# Patient Record
Sex: Female | Born: 1950 | Race: Asian | Hispanic: No | Smoking: Never smoker
Health system: Southern US, Community
[De-identification: ages and names within clinical notes are randomized; demographics above are authoritative.]

## PROBLEM LIST (undated history)

## (undated) DIAGNOSIS — K219 Gastro-esophageal reflux disease without esophagitis: Secondary | ICD-10-CM

## (undated) DIAGNOSIS — I10 Essential (primary) hypertension: Secondary | ICD-10-CM

## (undated) DIAGNOSIS — E039 Hypothyroidism, unspecified: Secondary | ICD-10-CM

## (undated) DIAGNOSIS — K8020 Calculus of gallbladder without cholecystitis without obstruction: Secondary | ICD-10-CM

## (undated) DIAGNOSIS — B181 Chronic viral hepatitis B without delta-agent: Secondary | ICD-10-CM

## (undated) DIAGNOSIS — E0500 Thyrotoxicosis with diffuse goiter without thyrotoxic crisis or storm: Secondary | ICD-10-CM

## (undated) DIAGNOSIS — R7689 Other specified abnormal immunological findings in serum: Secondary | ICD-10-CM

## (undated) DIAGNOSIS — R768 Other specified abnormal immunological findings in serum: Secondary | ICD-10-CM

## (undated) DIAGNOSIS — E790 Hyperuricemia without signs of inflammatory arthritis and tophaceous disease: Secondary | ICD-10-CM

## (undated) DIAGNOSIS — E119 Type 2 diabetes mellitus without complications: Secondary | ICD-10-CM

## (undated) HISTORY — PX: TUBAL LIGATION: SHX77

---

## 2003-10-17 ENCOUNTER — Other Ambulatory Visit: Payer: Self-pay

## 2005-06-17 ENCOUNTER — Ambulatory Visit: Payer: Self-pay | Admitting: Unknown Physician Specialty

## 2005-09-03 ENCOUNTER — Ambulatory Visit: Payer: Self-pay | Admitting: Unknown Physician Specialty

## 2006-03-18 ENCOUNTER — Ambulatory Visit: Payer: Self-pay | Admitting: Rheumatology

## 2006-12-05 ENCOUNTER — Ambulatory Visit: Payer: Self-pay | Admitting: Gastroenterology

## 2006-12-25 ENCOUNTER — Ambulatory Visit: Payer: Self-pay | Admitting: Family Medicine

## 2007-12-25 ENCOUNTER — Ambulatory Visit: Payer: Self-pay | Admitting: Gastroenterology

## 2010-06-06 ENCOUNTER — Ambulatory Visit: Payer: Self-pay | Admitting: Internal Medicine

## 2010-06-08 ENCOUNTER — Ambulatory Visit: Payer: Self-pay | Admitting: Internal Medicine

## 2010-07-24 ENCOUNTER — Ambulatory Visit: Payer: Self-pay | Admitting: Gastroenterology

## 2010-07-25 LAB — PATHOLOGY REPORT

## 2010-11-15 ENCOUNTER — Ambulatory Visit: Payer: Self-pay | Admitting: Family Medicine

## 2013-07-03 ENCOUNTER — Emergency Department: Payer: Self-pay | Admitting: Emergency Medicine

## 2014-01-12 ENCOUNTER — Ambulatory Visit: Payer: Self-pay | Admitting: Family Medicine

## 2014-07-15 ENCOUNTER — Emergency Department: Payer: Self-pay | Admitting: Emergency Medicine

## 2014-09-19 ENCOUNTER — Ambulatory Visit
Admission: RE | Admit: 2014-09-19 | Payer: BLUE CROSS/BLUE SHIELD | Source: Ambulatory Visit | Admitting: Gastroenterology

## 2014-09-19 ENCOUNTER — Encounter: Admission: RE | Payer: Self-pay | Source: Ambulatory Visit

## 2014-09-19 SURGERY — EGD (ESOPHAGOGASTRODUODENOSCOPY)
Anesthesia: General

## 2014-12-12 ENCOUNTER — Encounter: Payer: Self-pay | Admitting: *Deleted

## 2014-12-12 DIAGNOSIS — E0590 Thyrotoxicosis, unspecified without thyrotoxic crisis or storm: Secondary | ICD-10-CM | POA: Diagnosis not present

## 2014-12-12 DIAGNOSIS — K210 Gastro-esophageal reflux disease with esophagitis: Secondary | ICD-10-CM | POA: Diagnosis not present

## 2014-12-12 DIAGNOSIS — Z79899 Other long term (current) drug therapy: Secondary | ICD-10-CM | POA: Diagnosis not present

## 2014-12-12 DIAGNOSIS — R1013 Epigastric pain: Secondary | ICD-10-CM | POA: Diagnosis present

## 2014-12-12 DIAGNOSIS — E119 Type 2 diabetes mellitus without complications: Secondary | ICD-10-CM | POA: Diagnosis not present

## 2014-12-12 DIAGNOSIS — B1910 Unspecified viral hepatitis B without hepatic coma: Secondary | ICD-10-CM | POA: Diagnosis not present

## 2014-12-12 DIAGNOSIS — I10 Essential (primary) hypertension: Secondary | ICD-10-CM | POA: Diagnosis not present

## 2014-12-12 DIAGNOSIS — K2270 Barrett's esophagus without dysplasia: Secondary | ICD-10-CM | POA: Diagnosis not present

## 2014-12-12 DIAGNOSIS — I252 Old myocardial infarction: Secondary | ICD-10-CM | POA: Diagnosis not present

## 2014-12-12 NOTE — Anesthesia Preprocedure Evaluation (Addendum)
Anesthesia Evaluation  Patient identified by MRN, date of birth, ID band Patient awake    Reviewed: Allergy & Precautions, H&P , NPO status , Patient's Chart, lab work & pertinent test results  Airway Mallampati: II  TM Distance: >3 FB Neck ROM: full    Dental  (+) Poor Dentition, Chipped, Missing   Pulmonary neg pulmonary ROS,  breath sounds clear to auscultation  Pulmonary exam normal       Cardiovascular Exercise Tolerance: Good hypertension, - Past MI Normal cardiovascular examRhythm:regular Rate:Normal     Neuro/Psych negative neurological ROS  negative psych ROS   GI/Hepatic GERD-  Controlled,(+) Hepatitis -, B  Endo/Other  negative endocrine ROSdiabetes, Type 2Hypothyroidism Hyperthyroidism   Renal/GU negative Renal ROS  negative genitourinary   Musculoskeletal   Abdominal   Peds  Hematology negative hematology ROS (+)   Anesthesia Other Findings Past Medical History:   Hypertension                                                 Graves disease                                               Hyperuricemia                                                Rheumatoid factor positive                                   Chronic hepatitis B                                          Acquired hypothyroidism                                      Diabetes mellitus without complication                       GERD (gastroesophageal reflux disease)                       Reproductive/Obstetrics negative OB ROS                            Anesthesia Physical Anesthesia Plan  ASA: III  Anesthesia Plan: General   Post-op Pain Management:    Induction:   Airway Management Planned:   Additional Equipment:   Intra-op Plan:   Post-operative Plan:   Informed Consent: I have reviewed the patients History and Physical, chart, labs and discussed the procedure including the risks, benefits and  alternatives for the proposed anesthesia with the patient or authorized representative who has indicated his/her understanding and acceptance.   Dental Advisory Given  Plan Discussed with: Anesthesiologist, CRNA and Surgeon  Anesthesia Plan Comments:  Anesthesia Quick Evaluation  

## 2014-12-13 ENCOUNTER — Encounter: Payer: Self-pay | Admitting: *Deleted

## 2014-12-13 ENCOUNTER — Ambulatory Visit
Admission: RE | Admit: 2014-12-13 | Discharge: 2014-12-13 | Disposition: A | Discharge status: Home / Self Care | Payer: BLUE CROSS/BLUE SHIELD | Source: Ambulatory Visit | Attending: Gastroenterology | Admitting: Gastroenterology

## 2014-12-13 ENCOUNTER — Encounter
Admission: RE | Disposition: A | Discharge status: Home / Self Care | Payer: Self-pay | Source: Ambulatory Visit | Attending: Gastroenterology

## 2014-12-13 ENCOUNTER — Ambulatory Visit: Payer: BLUE CROSS/BLUE SHIELD | Admitting: Anesthesiology

## 2014-12-13 DIAGNOSIS — B1910 Unspecified viral hepatitis B without hepatic coma: Secondary | ICD-10-CM | POA: Insufficient documentation

## 2014-12-13 DIAGNOSIS — I10 Essential (primary) hypertension: Secondary | ICD-10-CM | POA: Insufficient documentation

## 2014-12-13 DIAGNOSIS — K210 Gastro-esophageal reflux disease with esophagitis: Secondary | ICD-10-CM | POA: Insufficient documentation

## 2014-12-13 DIAGNOSIS — K2270 Barrett's esophagus without dysplasia: Secondary | ICD-10-CM | POA: Insufficient documentation

## 2014-12-13 DIAGNOSIS — E0590 Thyrotoxicosis, unspecified without thyrotoxic crisis or storm: Secondary | ICD-10-CM | POA: Insufficient documentation

## 2014-12-13 DIAGNOSIS — I252 Old myocardial infarction: Secondary | ICD-10-CM | POA: Insufficient documentation

## 2014-12-13 DIAGNOSIS — Z79899 Other long term (current) drug therapy: Secondary | ICD-10-CM | POA: Insufficient documentation

## 2014-12-13 DIAGNOSIS — E119 Type 2 diabetes mellitus without complications: Secondary | ICD-10-CM | POA: Insufficient documentation

## 2014-12-13 HISTORY — DX: Chronic viral hepatitis B without delta-agent: B18.1

## 2014-12-13 HISTORY — DX: Type 2 diabetes mellitus without complications: E11.9

## 2014-12-13 HISTORY — DX: Other specified abnormal immunological findings in serum: R76.89

## 2014-12-13 HISTORY — DX: Hyperuricemia without signs of inflammatory arthritis and tophaceous disease: E79.0

## 2014-12-13 HISTORY — DX: Thyrotoxicosis with diffuse goiter without thyrotoxic crisis or storm: E05.00

## 2014-12-13 HISTORY — DX: Essential (primary) hypertension: I10

## 2014-12-13 HISTORY — DX: Hypothyroidism, unspecified: E03.9

## 2014-12-13 HISTORY — PX: ESOPHAGOGASTRODUODENOSCOPY (EGD) WITH PROPOFOL: SHX5813

## 2014-12-13 HISTORY — DX: Gastro-esophageal reflux disease without esophagitis: K21.9

## 2014-12-13 HISTORY — DX: Other specified abnormal immunological findings in serum: R76.8

## 2014-12-13 SURGERY — ESOPHAGOGASTRODUODENOSCOPY (EGD) WITH PROPOFOL
Anesthesia: General

## 2014-12-13 MED ORDER — LIDOCAINE HCL (CARDIAC) 20 MG/ML IV SOLN
INTRAVENOUS | Status: DC | PRN
  Administered 2014-12-13: 20 mg via INTRAVENOUS

## 2014-12-13 MED ORDER — GLYCOPYRROLATE 0.2 MG/ML IJ SOLN
INTRAMUSCULAR | Status: DC | PRN
  Administered 2014-12-13: 0.2 mg via INTRAVENOUS

## 2014-12-13 MED ORDER — SODIUM CHLORIDE 0.9 % IV SOLN: INTRAVENOUS | Status: DC

## 2014-12-13 MED ORDER — SODIUM CHLORIDE 0.9 % IV SOLN
INTRAVENOUS | Status: DC
  Administered 2014-12-13: 09:00:00 via INTRAVENOUS

## 2014-12-13 MED ORDER — PROPOFOL 10 MG/ML IV BOLUS
INTRAVENOUS | Status: DC | PRN
  Administered 2014-12-13: 60 mg via INTRAVENOUS
  Administered 2014-12-13: 30 mg via INTRAVENOUS

## 2014-12-13 MED ORDER — PROPOFOL INFUSION 10 MG/ML OPTIME
INTRAVENOUS | Status: DC | PRN
  Administered 2014-12-13: 120 ug/kg/min via INTRAVENOUS

## 2014-12-13 NOTE — Anesthesia Postprocedure Evaluation (Signed)
  Anesthesia Post-op Note  Patient: Sydney Howe  Procedure(s) Performed: Procedure(s): ESOPHAGOGASTRODUODENOSCOPY (EGD) WITH PROPOFOL (N/A)  Anesthesia type:General  Patient location: PACU  Post pain: Pain level controlled  Post assessment: Post-op Vital signs reviewed, Patient's Cardiovascular Status Stable, Respiratory Function Stable, Patent Airway and No signs of Nausea or vomiting  Post vital signs: Reviewed and stable  Last Vitals:  Filed Vitals:   12/13/14 1035  BP: 112/76  Pulse: 51  Temp:   Resp: 13    Level of consciousness: awake, alert  and patient cooperative  Complications: No apparent anesthesia complications

## 2014-12-13 NOTE — Op Note (Signed)
[facility] Gastroenterology Patient Name: [facility] Procedure Date: 12/13/2014 9:42 AM MRN: [id number] Account #: [id number] Date of Birth: 09/01/1950 Admit Type: Outpatient Age: 64 Room: [facility] ENDO ROOM 4 Gender: Female Note Status: Finalized Procedure:         Upper GI endoscopy Indications:       Epigastric abdominal pain, Suspected esophageal reflux,                     Possible Barrett's on previous EGD in 2012. Providers:         Ezzard Standing. Bluford Kaufmann, MD Medicines:         Monitored Anesthesia Care Complications:     No immediate complications. Procedure:         Pre-Anesthesia Assessment:                    - Prior to the procedure, a History and Physical was                     performed, and patient medications, allergies and                     sensitivities were reviewed. The patient's tolerance of                     previous anesthesia was reviewed.                    - The risks and benefits of the procedure and the sedation                     options and risks were discussed with the patient. All                     questions were answered and informed consent was obtained.                    - After reviewing the risks and benefits, the patient was                     deemed in satisfactory condition to undergo the procedure.                    After obtaining informed consent, the endoscope was passed                     under direct vision. Throughout the procedure, the                     patient's blood pressure, pulse, and oxygen saturations                     were monitored continuously. The Olympus GIF-160 endoscope                     (S#. [id number]) was introduced through the mouth, and                     advanced to the second part of duodenum. The upper GI                     endoscopy was accomplished without difficulty. The patient                     tolerated the procedure  well. Findings:      The examined esophagus was normal. Multiple  biopsies of GE junction area       were taken with a cold forceps for histology.      The entire examined stomach was normal.      The examined duodenum was normal. Impression:        - Normal esophagus. Biopsied.                    - Normal stomach.                    - Normal examined duodenum. Recommendation:    - Discharge patient to home.                    - Observe patient's clinical course.                    - Await pathology results.                    - Continue present medications.                    - The findings and recommendations were discussed with the                     patient.                    - If bx confirm Barrett's, then will need EGD q 104yrs. Procedure Code(s): --- Professional ---                    [phone], Esophagogastroduodenoscopy, flexible, transoral;                     with biopsy, single or multiple Diagnosis Code(s): --- Professional ---                    R10.13, Epigastric pain CPT copyright 2014 American Medical Association. All rights reserved. The codes documented in this report are preliminary and upon coder review may  be revised to meet current compliance requirements. Wallace Cullens, MD 12/13/2014 10:00:51 AM This report has been signed electronically. Number of Addenda: 0 Note Initiated On: 12/13/2014 9:42 AM      [facility]

## 2014-12-13 NOTE — OR Nursing (Signed)
[place]otian interpreter present the entire recovery time.  All instructions and perscriptions were given to the son at time of discharge.

## 2014-12-13 NOTE — H&P (Signed)
  Date of Initial H&P: 12/08/2014  History reviewed, patient examined, no change in status, stable for surgery.

## 2014-12-13 NOTE — Transfer of Care (Signed)
Immediate Anesthesia Transfer of Care Note  Patient: Sydney Howe  Procedure(s) Performed: Procedure(s): ESOPHAGOGASTRODUODENOSCOPY (EGD) WITH PROPOFOL (N/A)  Patient Location: PACU and Endoscopy Unit  Anesthesia Type:General  Level of Consciousness: sedated  Airway & Oxygen Therapy: Patient Spontanous Breathing and Patient connected to nasal cannula oxygen  Post-op Assessment: Report given to RN and Post -op Vital signs reviewed and stable  Post vital signs: Reviewed and stable  Last Vitals:  Filed Vitals:   12/13/14 0919  BP: 125/71  Pulse: 58  Temp: 36.3 C  Resp: 18    Complications: No apparent anesthesia complications

## 2014-12-14 ENCOUNTER — Encounter: Payer: Self-pay | Admitting: Gastroenterology

## 2014-12-14 LAB — SURGICAL PATHOLOGY

## 2016-03-27 ENCOUNTER — Other Ambulatory Visit: Payer: Self-pay | Admitting: Family Medicine

## 2016-03-27 DIAGNOSIS — Z1231 Encounter for screening mammogram for malignant neoplasm of breast: Secondary | ICD-10-CM

## 2016-05-03 ENCOUNTER — Ambulatory Visit
Admission: RE | Admit: 2016-05-03 | Discharge: 2016-05-03 | Disposition: A | Discharge status: Home / Self Care | Payer: BLUE CROSS/BLUE SHIELD | Source: Ambulatory Visit | Attending: Family Medicine | Admitting: Family Medicine

## 2016-05-03 DIAGNOSIS — Z1231 Encounter for screening mammogram for malignant neoplasm of breast: Secondary | ICD-10-CM | POA: Diagnosis present

## 2017-03-11 ENCOUNTER — Other Ambulatory Visit: Payer: Self-pay | Admitting: Family Medicine

## 2017-03-11 DIAGNOSIS — Z1231 Encounter for screening mammogram for malignant neoplasm of breast: Secondary | ICD-10-CM

## 2017-05-05 ENCOUNTER — Ambulatory Visit
Admission: RE | Admit: 2017-05-05 | Discharge: 2017-05-05 | Disposition: A | Discharge status: Home / Self Care | Payer: BLUE CROSS/BLUE SHIELD | Source: Ambulatory Visit | Attending: Family Medicine | Admitting: Family Medicine

## 2017-05-05 DIAGNOSIS — Z1231 Encounter for screening mammogram for malignant neoplasm of breast: Secondary | ICD-10-CM | POA: Diagnosis not present

## 2017-09-09 ENCOUNTER — Other Ambulatory Visit: Payer: Self-pay

## 2017-09-09 ENCOUNTER — Emergency Department: Payer: BLUE CROSS/BLUE SHIELD

## 2017-09-09 ENCOUNTER — Inpatient Hospital Stay
Admission: EM | Admit: 2017-09-09 | Discharge: 2017-09-12 | DRG: 439 | Disposition: A | Discharge status: Home / Self Care | Payer: BLUE CROSS/BLUE SHIELD | Attending: Internal Medicine | Admitting: Internal Medicine

## 2017-09-09 ENCOUNTER — Encounter: Payer: Self-pay | Admitting: Emergency Medicine

## 2017-09-09 DIAGNOSIS — R001 Bradycardia, unspecified: Secondary | ICD-10-CM | POA: Diagnosis not present

## 2017-09-09 DIAGNOSIS — K8500 Idiopathic acute pancreatitis without necrosis or infection: Secondary | ICD-10-CM | POA: Diagnosis present

## 2017-09-09 DIAGNOSIS — Z7984 Long term (current) use of oral hypoglycemic drugs: Secondary | ICD-10-CM

## 2017-09-09 DIAGNOSIS — I10 Essential (primary) hypertension: Secondary | ICD-10-CM | POA: Diagnosis present

## 2017-09-09 DIAGNOSIS — K219 Gastro-esophageal reflux disease without esophagitis: Secondary | ICD-10-CM | POA: Diagnosis present

## 2017-09-09 DIAGNOSIS — E0500 Thyrotoxicosis with diffuse goiter without thyrotoxic crisis or storm: Secondary | ICD-10-CM | POA: Diagnosis present

## 2017-09-09 DIAGNOSIS — E860 Dehydration: Secondary | ICD-10-CM | POA: Diagnosis present

## 2017-09-09 DIAGNOSIS — E871 Hypo-osmolality and hyponatremia: Secondary | ICD-10-CM | POA: Diagnosis present

## 2017-09-09 DIAGNOSIS — T464X5A Adverse effect of angiotensin-converting-enzyme inhibitors, initial encounter: Secondary | ICD-10-CM | POA: Diagnosis present

## 2017-09-09 DIAGNOSIS — E119 Type 2 diabetes mellitus without complications: Secondary | ICD-10-CM | POA: Diagnosis present

## 2017-09-09 DIAGNOSIS — I959 Hypotension, unspecified: Secondary | ICD-10-CM | POA: Diagnosis not present

## 2017-09-09 DIAGNOSIS — K853 Drug induced acute pancreatitis without necrosis or infection: Secondary | ICD-10-CM | POA: Diagnosis present

## 2017-09-09 DIAGNOSIS — K859 Acute pancreatitis without necrosis or infection, unspecified: Secondary | ICD-10-CM | POA: Diagnosis present

## 2017-09-09 DIAGNOSIS — Z79899 Other long term (current) drug therapy: Secondary | ICD-10-CM | POA: Diagnosis not present

## 2017-09-09 DIAGNOSIS — M109 Gout, unspecified: Secondary | ICD-10-CM | POA: Diagnosis present

## 2017-09-09 DIAGNOSIS — B181 Chronic viral hepatitis B without delta-agent: Secondary | ICD-10-CM | POA: Diagnosis present

## 2017-09-09 LAB — COMPREHENSIVE METABOLIC PANEL
ALBUMIN: 4.3 g/dL (ref 3.5–5.0)
ALK PHOS: 48 U/L (ref 38–126)
ALT: 16 U/L (ref 14–54)
ANION GAP: 5 (ref 5–15)
AST: 24 U/L (ref 15–41)
BILIRUBIN TOTAL: 0.5 mg/dL (ref 0.3–1.2)
BUN: 22 mg/dL — AB (ref 6–20)
CALCIUM: 9.7 mg/dL (ref 8.9–10.3)
CO2: 27 mmol/L (ref 22–32)
CREATININE: 0.78 mg/dL (ref 0.44–1.00)
Chloride: 100 mmol/L — ABNORMAL LOW (ref 101–111)
GFR calc Af Amer: >60 mL/min (ref >60)
GFR calc non Af Amer: >60 mL/min (ref >60)
GLUCOSE: 105 mg/dL — AB (ref 65–99)
Potassium: 4.3 mmol/L (ref 3.5–5.1)
Sodium: 132 mmol/L — ABNORMAL LOW (ref 135–145)
TOTAL PROTEIN: 8.4 g/dL — AB (ref 6.5–8.1)

## 2017-09-09 LAB — URINALYSIS, COMPLETE (UACMP) WITH MICROSCOPIC
Bacteria, UA: NONE SEEN
Bilirubin Urine: NEGATIVE
GLUCOSE, UA: NEGATIVE mg/dL
HGB URINE DIPSTICK: NEGATIVE
Ketones, ur: NEGATIVE mg/dL
Leukocytes, UA: NEGATIVE
NITRITE: NEGATIVE
Protein, ur: NEGATIVE mg/dL
SPECIFIC GRAVITY, URINE: 1.015 (ref 1.005–1.030)
pH: 6 (ref 5.0–8.0)

## 2017-09-09 LAB — CBC
HCT: 34.2 % — ABNORMAL LOW (ref 35.0–47.0)
Hemoglobin: 11.1 g/dL — ABNORMAL LOW (ref 12.0–16.0)
MCH: 23.5 pg — AB (ref 26.0–34.0)
MCHC: 32.4 g/dL (ref 32.0–36.0)
MCV: 72.5 fL — ABNORMAL LOW (ref 80.0–100.0)
PLATELETS: 234 10*3/uL (ref 150–440)
RBC: 4.72 MIL/uL (ref 3.80–5.20)
RDW: 17.5 % — AB (ref 11.5–14.5)
WBC: 5.9 10*3/uL (ref 3.6–11.0)

## 2017-09-09 LAB — TROPONIN I
Troponin I: 0.03 ng/mL (ref <0.03)
Troponin I: <0.03 ng/mL (ref <0.03)

## 2017-09-09 LAB — LIPASE, BLOOD: Lipase: 224 U/L — ABNORMAL HIGH (ref 11–51)

## 2017-09-09 LAB — GLUCOSE, CAPILLARY: Glucose-Capillary: 106 mg/dL — ABNORMAL HIGH (ref 65–99)

## 2017-09-09 MED ORDER — ACETAMINOPHEN 325 MG PO TABS
650.0000 mg | ORAL_TABLET | Freq: Four times a day (QID) | ORAL | Status: DC | PRN
  Administered 2017-09-10: 650 mg via ORAL
  Filled 2017-09-09: qty 2

## 2017-09-09 MED ORDER — IOPAMIDOL (ISOVUE-370) INJECTION 76%
75.0000 mL | Freq: Once | INTRAVENOUS | Status: AC | PRN
  Administered 2017-09-09: 75 mL via INTRAVENOUS

## 2017-09-09 MED ORDER — OMEPRAZOLE MAGNESIUM 20 MG PO TBEC: 20.0000 mg | DELAYED_RELEASE_TABLET | Freq: Every day | ORAL | Status: DC

## 2017-09-09 MED ORDER — INSULIN ASPART 100 UNIT/ML ~~LOC~~ SOLN
0.0000 [IU] | Freq: Three times a day (TID) | SUBCUTANEOUS | Status: DC
  Administered 2017-09-11 – 2017-09-12 (×2): 1 [IU] via SUBCUTANEOUS
  Filled 2017-09-09 (×2): qty 1

## 2017-09-09 MED ORDER — MORPHINE SULFATE (PF) 4 MG/ML IV SOLN
4.0000 mg | Freq: Once | INTRAVENOUS | Status: AC
  Administered 2017-09-09: 4 mg via INTRAVENOUS
  Filled 2017-09-09: qty 1

## 2017-09-09 MED ORDER — ONDANSETRON HCL 4 MG/2ML IJ SOLN
4.0000 mg | Freq: Once | INTRAMUSCULAR | Status: AC
  Administered 2017-09-09: 4 mg via INTRAVENOUS
  Filled 2017-09-09: qty 2

## 2017-09-09 MED ORDER — ONDANSETRON HCL 4 MG PO TABS: 4.0000 mg | ORAL_TABLET | Freq: Four times a day (QID) | ORAL | Status: DC | PRN

## 2017-09-09 MED ORDER — ACETAMINOPHEN 650 MG RE SUPP: 650.0000 mg | Freq: Four times a day (QID) | RECTAL | Status: DC | PRN

## 2017-09-09 MED ORDER — BISACODYL 5 MG PO TBEC: 5.0000 mg | DELAYED_RELEASE_TABLET | Freq: Every day | ORAL | Status: DC | PRN

## 2017-09-09 MED ORDER — SENNOSIDES-DOCUSATE SODIUM 8.6-50 MG PO TABS: 1.0000 | ORAL_TABLET | Freq: Every evening | ORAL | Status: DC | PRN

## 2017-09-09 MED ORDER — ENOXAPARIN SODIUM 40 MG/0.4ML ~~LOC~~ SOLN
40.0000 mg | SUBCUTANEOUS | Status: DC
  Administered 2017-09-09 – 2017-09-11 (×3): 40 mg via SUBCUTANEOUS
  Filled 2017-09-09 (×4): qty 0.4

## 2017-09-09 MED ORDER — INSULIN ASPART 100 UNIT/ML ~~LOC~~ SOLN: 0.0000 [IU] | Freq: Every day | SUBCUTANEOUS | Status: DC

## 2017-09-09 MED ORDER — ONDANSETRON HCL 4 MG/2ML IJ SOLN
4.0000 mg | Freq: Four times a day (QID) | INTRAMUSCULAR | Status: DC | PRN
  Administered 2017-09-09: 4 mg via INTRAVENOUS
  Filled 2017-09-09: qty 2

## 2017-09-09 MED ORDER — HYDROCODONE-ACETAMINOPHEN 5-325 MG PO TABS: 1.0000 | ORAL_TABLET | ORAL | Status: DC | PRN

## 2017-09-09 MED ORDER — MORPHINE SULFATE (PF) 2 MG/ML IV SOLN: 2.0000 mg | INTRAVENOUS | Status: DC | PRN

## 2017-09-09 MED ORDER — LACTATED RINGERS IV SOLN
INTRAVENOUS | Status: DC
  Administered 2017-09-09: 20:00:00 via INTRAVENOUS

## 2017-09-09 MED ORDER — ALBUTEROL SULFATE (2.5 MG/3ML) 0.083% IN NEBU: 2.5000 mg | INHALATION_SOLUTION | RESPIRATORY_TRACT | Status: DC | PRN

## 2017-09-09 MED ORDER — SODIUM CHLORIDE 0.9 % IV SOLN
INTRAVENOUS | Status: DC
  Administered 2017-09-09 – 2017-09-12 (×6): via INTRAVENOUS

## 2017-09-09 MED ORDER — PANTOPRAZOLE SODIUM 40 MG PO TBEC
40.0000 mg | DELAYED_RELEASE_TABLET | Freq: Every day | ORAL | Status: DC
  Administered 2017-09-09 – 2017-09-12 (×4): 40 mg via ORAL
  Filled 2017-09-09 (×4): qty 1

## 2017-09-09 MED ORDER — ALLOPURINOL 100 MG PO TABS
300.0000 mg | ORAL_TABLET | Freq: Every day | ORAL | Status: DC
  Administered 2017-09-09 – 2017-09-12 (×4): 300 mg via ORAL
  Filled 2017-09-09 (×4): qty 3

## 2017-09-09 MED ORDER — LEVOTHYROXINE SODIUM 50 MCG PO TABS
50.0000 ug | ORAL_TABLET | Freq: Every day | ORAL | Status: DC
  Administered 2017-09-10 – 2017-09-12 (×3): 50 ug via ORAL
  Filled 2017-09-09 (×3): qty 1

## 2017-09-09 NOTE — ED Triage Notes (Signed)
Also says she had chest pain during the  Night.  Got better after moving bowels.

## 2017-09-09 NOTE — H&P (Addendum)
[facility] - [place] at [facility]   PATIENT NAME: Sydney Howe    MR#:  [id number]  DATE OF BIRTH:  09/01/1950  DATE OF ADMISSION:  09/09/2017  PRIMARY CARE PHYSICIAN: Wallace Cullens, MD (Inactive)   REQUESTING/REFERRING PHYSICIAN: Dr. Scotty Court.  CHIEF COMPLAINT:   Chief Complaint  Patient presents with  . Abdominal Pain   Abdominal pain for several weeks, worsening for several days. HISTORY OF PRESENT ILLNESS:  Sydney Howe  is a 67 y.o. female with a known history of multiple medical problems as below.  The patient has had abdominal pain for several weeks but worsening for the past few days.  The abdominal pain is seen in epigastric area, intermittent with radiation to back.  The patient also complains of nausea, vomiting and diarrhea sometimes.  She denies any melena or bloody stool.  Her lipase is elevated.  CAT scan of abdomen show acute pancreatitis.  PAST MEDICAL HISTORY:   Past Medical History:  Diagnosis Date  . Acquired hypothyroidism   . Chronic hepatitis B (HCC)   . Diabetes mellitus without complication (HCC)   . Diet-controlled diabetes mellitus (HCC)   . GERD (gastroesophageal reflux disease)   . Graves disease   . Hypertension   . Hyperuricemia   . Rheumatoid factor positive     PAST SURGICAL HISTORY:   Past Surgical History:  Procedure Laterality Date  . ESOPHAGOGASTRODUODENOSCOPY (EGD) WITH PROPOFOL N/A 12/13/2014   Procedure: ESOPHAGOGASTRODUODENOSCOPY (EGD) WITH PROPOFOL;  Surgeon: Wallace Cullens, MD;  Location: [facility] ENDOSCOPY;  Service: Gastroenterology;  Laterality: N/A;    SOCIAL HISTORY:   Social History   Tobacco Use  . Smoking status: Never Smoker  . Smokeless tobacco: Never Used  Substance Use Topics  . Alcohol use: Never    Frequency: Never    FAMILY HISTORY:   Family History  Problem Relation Age of Onset  . Kidney disease Neg Hx     DRUG ALLERGIES:  No Known Allergies  REVIEW OF SYSTEMS:   Review of  Systems  Constitutional: Positive for malaise/fatigue. Negative for chills and fever.  HENT: Negative for sore throat.   Eyes: Negative for blurred vision and double vision.  Respiratory: Negative for cough, hemoptysis, shortness of breath, wheezing and stridor.   Cardiovascular: Negative for chest pain, palpitations, orthopnea and leg swelling.  Gastrointestinal: Positive for abdominal pain, diarrhea, nausea and vomiting. Negative for blood in stool and melena.  Genitourinary: Negative for dysuria, flank pain and hematuria.  Musculoskeletal: Negative for back pain and joint pain.  Skin: Negative for rash.  Neurological: Negative for dizziness, sensory change, focal weakness, seizures, loss of consciousness, weakness and headaches.  Endo/Heme/Allergies: Negative for polydipsia.  Psychiatric/Behavioral: Negative for depression. The patient is not nervous/anxious.     MEDICATIONS AT HOME:   Prior to Admission medications   Medication Sig Start Date End Date Taking? Authorizing Provider  allopurinol (ZYLOPRIM) 300 MG tablet Take 1 tablet by mouth daily. 03/04/17  Yes [provider]  levothyroxine (SYNTHROID, LEVOTHROID) 50 MCG tablet Take 50 mcg by mouth daily before breakfast.    Yes [provider]  lisinopril (PRINIVIL,ZESTRIL) 20 MG tablet Take 20 mg by mouth daily.   Yes [provider]  metFORMIN (GLUCOPHAGE) 500 MG tablet Take 1 tablet by mouth 2 (two) times daily.   Yes [provider]  omeprazole (PRILOSEC OTC) 20 MG tablet Take 1 tablet by mouth daily. 09/04/17  Yes [provider]      VITAL  SIGNS:  Blood pressure 128/80, pulse (!) 49, temperature 98.2 F (36.8 C), temperature source Oral, resp. rate 16, height  (1.626 m), weight 148 lb 9.4 oz (67.4 kg), SpO2 100 %.  PHYSICAL EXAMINATION:  Physical Exam  GENERAL:  67 y.o.-year-old patient lying in the bed with no acute distress.  EYES: Pupils equal, round, reactive to light  and accommodation. No scleral icterus. Extraocular muscles intact.  HEENT: Head atraumatic, normocephalic. Oropharynx and nasopharynx clear.  NECK:  Supple, no jugular venous distention. No thyroid enlargement, no tenderness.  LUNGS: Normal breath sounds bilaterally, no wheezing, rales,rhonchi or crepitation. No use of accessory muscles of respiration.  CARDIOVASCULAR: S1, S2 normal. No murmurs, rubs, or gallops.  ABDOMEN: Soft, tenderness in epigastric area, nondistended. Bowel sounds present. No organomegaly or mass.  EXTREMITIES: No pedal edema, cyanosis, or clubbing.  NEUROLOGIC: Cranial nerves II through XII are intact. Muscle strength 5/5 in all extremities. Sensation intact. Gait not checked.  PSYCHIATRIC: The patient is alert and oriented x 3.  SKIN: No obvious rash, lesion, or ulcer.   LABORATORY PANEL:   CBC Recent Labs  Lab 09/09/17 1138  WBC 5.9  HGB 11.1*  HCT 34.2*  PLT 234   ------------------------------------------------------------------------------------------------------------------  Chemistries  Recent Labs  Lab 09/09/17 1138  NA 132*  K 4.3  CL 100*  CO2 27  GLUCOSE 105*  BUN 22*  CREATININE 0.78  CALCIUM 9.7  AST 24  ALT 16  ALKPHOS 48  BILITOT 0.5   ------------------------------------------------------------------------------------------------------------------  Cardiac Enzymes Recent Labs  Lab 09/09/17 1644  TROPONINI <0.03   ------------------------------------------------------------------------------------------------------------------  RADIOLOGY:  Ct Abdomen Pelvis W Contrast  Result Date: 09/09/2017 CLINICAL DATA:  Generalized abdominal pain. EXAM: CT ABDOMEN AND PELVIS WITH CONTRAST TECHNIQUE: Multidetector CT imaging of the abdomen and pelvis was performed using the standard protocol following bolus administration of intravenous contrast. CONTRAST:  75mL ISOVUE-370 IOPAMIDOL (ISOVUE-370) INJECTION 76% COMPARISON:  CT abdomen dated  June 08, 2010. FINDINGS: Lower chest: No acute abnormality. Mild cardiomegaly. Bibasilar atelectasis. Hepatobiliary: Heterogeneous enhancement of the right liver resolves on the delayed phase and is likely perfusional. Unchanged hemangioma within the right liver. Subcentimeter low-density lesions within the right liver and caudate lobe remain too small to characterize, but are stable. Interval decrease in size of the 3.6 cm simple cyst in the far left hepatic lobe in the left upper quadrant. No new focal liver abnormality. The gallbladder is unremarkable. Mild central and left intrahepatic biliary dilatation is unchanged. The common bile duct is mildly dilated, now measuring 11 mm, previously 7 mm. Pancreas: Mild inflammation surrounding the pancreatic head. Mild atrophy of the body and tail. No ductal dilatation. Spleen: Normal in size without focal abnormality. Adrenals/Urinary Tract: The adrenal glands are unremarkable. Subcentimeter low-density lesions in the right kidney are too small to characterize. No renal or ureteral calculi. No hydronephrosis. The bladder is unremarkable. Stomach/Bowel: The stomach is within normal limits. Mild wall thickening and minimal inflammatory stranding surrounding the second and third portion of the duodenum, likely reactive. No bowel obstruction. Mild left-sided colonic diverticulosis. Normal appendix. Vascular/Lymphatic: Aortic atherosclerosis. No enlarged abdominal or pelvic lymph nodes. Reproductive: Uterus and bilateral adnexa are unremarkable. Other: No abdominal wall hernia or abnormality. No abdominopelvic ascites. No pneumoperitoneum. Musculoskeletal: No acute or significant osseous findings. IMPRESSION: 1. Mild inflammation surrounding the pancreatic head likely reflects acute pancreatitis. No peripancreatic fluid collection. 2. Mild intra and extrahepatic biliary dilatation without clear obstructing lesion. Correlate with LFTs and consider further evaluation with  MRCP  or ERCP. 3.  Aortic atherosclerosis (ICD10-I70.0). Electronically Signed   By: Obie Dredge M.D.   On: 09/09/2017 15:47      IMPRESSION AND PLAN:   Acute pancreatitis. The patient will be admitted to medical floor. Pain control, Zofran as needed, n.p.o. with IV fluid support, follow-up lipase in a.m. Hyponatremia and dehydration.  IV fluids support in the follow-up BMP. Hypertension.  Hold lisinopril due to low side blood pressure.   Diabetes.  Start sliding scale.  The patient does speak [place] language. No tele [place] interpreter is available. All information and the physical examination are interpreted by her son. All the records are reviewed and case discussed with ED provider. Management plans discussed with the patient, her son and they are in agreement.  CODE STATUS: full code.  TOTAL TIME TAKING CARE OF THIS PATIENT: 57 minutes.    Shaune Pollack M.D on 09/09/2017 at 8:40 PM  Between 7am to 6pm - Pager - [phone]  After 6pm go to [email or web address] - Scientist, research (life sciences) [facility]  Office  [phone]  CC: Primary care physician; Wallace Cullens, MD (Inactive)   Note: This dictation was prepared with Dragon dictation along with smaller phrase technology. Any transcriptional errors that result from this process are unin

## 2017-09-09 NOTE — ED Triage Notes (Signed)
Triaged with pacific interpretters on ipad audio .

## 2017-09-09 NOTE — ED Notes (Signed)
Patient stated that she did not have to use the bathroom at this time. Patient was given a specimen cup to collect a urine sample when she can go.

## 2017-09-09 NOTE — ED Provider Notes (Signed)
 -----------------------------------------   7:20 PM on 09/09/2017 -----------------------------------------  Pt with persistent epigastric pain radiating to back. CT reveals pancreatitis, c/w labs. Will give repeat IV zofran and morphine and nausea and pain control IVF for hydration, NPO. Case d/w hospitalist for further management.  Final diagnoses:  Idiopathic acute pancreatitis without infection or necrosis      Sharman Cheek, MD 09/09/17 [phone]

## 2017-09-09 NOTE — ED Notes (Signed)
Pt son at bedside.

## 2017-09-09 NOTE — ED Provider Notes (Signed)
[facility] Emergency Department Provider Note       Time seen: ----------------------------------------- 2:06 PM on 09/09/2017 -----------------------------------------   I have reviewed the triage vital signs and the nursing notes.  HISTORY   Chief Complaint Abdominal Pain    HPI Sydney Howe is a 67 y.o. female with a history of chronic hepatitis B, diabetes, GERD, Graves' disease, hypertension, hyperuricemia who presents to the ED for chest pain during the night.  Patient states the pain got better after moving her bowels.  She describes epigastric pain that she has had through an interpreter for the past several weeks where she has epigastric pain on most days.  She has had nausea and vomiting as well.  She denies any diarrhea, fevers, chills or other complaints.  Past Medical History:  Diagnosis Date  . Acquired hypothyroidism   . Chronic hepatitis B (HCC)   . Diabetes mellitus without complication (HCC)   . Diet-controlled diabetes mellitus (HCC)   . GERD (gastroesophageal reflux disease)   . Graves disease   . Hypertension   . Hyperuricemia   . Rheumatoid factor positive     There are no active problems to display for this patient.   Past Surgical History:  Procedure Laterality Date  . ESOPHAGOGASTRODUODENOSCOPY (EGD) WITH PROPOFOL N/A 12/13/2014   Procedure: ESOPHAGOGASTRODUODENOSCOPY (EGD) WITH PROPOFOL;  Surgeon: Wallace Cullens, MD;  Location: [facility] ENDOSCOPY;  Service: Gastroenterology;  Laterality: N/A;    Allergies Patient has no known allergies.  Social History Social History   Tobacco Use  . Smoking status: Never Smoker  . Smokeless tobacco: Never Used  Substance Use Topics  . Alcohol use: Never    Frequency: Never  . Drug use: Not on file   Review of Systems Constitutional: Negative for fever. Cardiovascular: Negative for chest pain. Respiratory: Negative for shortness of breath. Gastrointestinal: Positive for abdominal  pain Genitourinary: Negative for dysuria. Musculoskeletal: Negative for back pain. Skin: Negative for rash. Neurological: Negative for headaches, focal weakness or numbness.  All systems negative/normal/unremarkable except as stated in the HPI  ____________________________________________   PHYSICAL EXAM:  VITAL SIGNS: ED Triage Vitals  Enc Vitals Group     BP 09/09/17 1051 107/68     Pulse Rate 09/09/17 1051 (!) 58     Resp 09/09/17 1051 15     Temp 09/09/17 1051 98.2 F (36.8 C)     Temp Source 09/09/17 1051 Oral     SpO2 09/09/17 1051 98 %     Weight 09/09/17 1052 140 lb (63.5 kg)     Height 09/09/17 1052  (1.549 m)     Head Circumference --      Peak Flow --      Pain Score 09/09/17 1114 7     Pain Loc --      Pain Edu? --      Excl. in GC? --    Constitutional: Alert and oriented. Well appearing and in no distress. Eyes: Conjunctivae are normal. Normal extraocular movements. ENT   Head: Normocephalic and atraumatic.   Nose: No congestion/rhinnorhea.   Mouth/Throat: Mucous membranes are moist.   Neck: No stridor. Cardiovascular: Normal rate, regular rhythm. No murmurs, rubs, or gallops. Respiratory: Normal respiratory effort without tachypnea nor retractions. Breath sounds are clear and equal bilaterally. No wheezes/rales/rhonchi. Gastrointestinal: Epigastric tenderness, no rebound or guarding.  Normal bowel sounds. Musculoskeletal: Nontender with normal range of motion in extremities. No lower extremity tenderness nor edema. Neurologic:  Normal speech and language. No  gross focal neurologic deficits are appreciated.  Skin:  Skin is warm, dry and intact. No rash noted. Psychiatric: Mood and affect are normal. Speech and behavior are normal.  ____________________________________________  EKG: Interpreted by me.  Sinus bradycardia rate 52 bpm, normal PR interval, normal QRS, normal QT.  ____________________________________________  ED  COURSE:  As part of my medical decision making, I reviewed the following data within the electronic MEDICAL RECORD NUMBER History obtained from family if available, nursing notes, old chart and ekg, as well as notes from prior ED visits. Patient presented for abdominal pain, we will assess with labs and imaging as indicated at this time.   Procedures ____________________________________________   LABS (pertinent positives/negatives)  Labs Reviewed  LIPASE, BLOOD - Abnormal; Notable for the following components:      Result Value   Lipase 224 (*)    All other components within normal limits  COMPREHENSIVE METABOLIC PANEL - Abnormal; Notable for the following components:   Sodium 132 (*)    Chloride 100 (*)    Glucose, Bld 105 (*)    BUN 22 (*)    Total Protein 8.4 (*)    All other components within normal limits  CBC - Abnormal; Notable for the following components:   Hemoglobin 11.1 (*)    HCT 34.2 (*)    MCV 72.5 (*)    MCH 23.5 (*)    RDW 17.5 (*)    All other components within normal limits  TROPONIN I - Abnormal; Notable for the following components:   Troponin I 0.03 (*)    All other components within normal limits  URINALYSIS, COMPLETE (UACMP) WITH MICROSCOPIC    RADIOLOGY Images were viewed by me  CT of the abdomen pelvis with contrast Is pending at this time. ____________________________________________  DIFFERENTIAL DIAGNOSIS   Gastritis, gastroenteritis, dehydration, electrolyte abnormality, pancreatitis  FINAL ASSESSMENT AND PLAN  Abdominal pain, pancreatitis   Plan: The patient had presented for abdominal pain. Patient's labs do indicate pancreatitis. Patient's imaging is pending at this time.  Unclear etiology for epigastric pain over the last month and minimally elevated troponin.  CT of the abdomen pelvis is pending and repeat troponin is pending at this time.   Ulice Dash, MD   Note: This note was generated in part or whole with voice  recognition software. Voice recognition is usually quite accurate but there are transcription errors that can and very often do occur. I apologize for any typographical errors that were not detected and corrected.     Emily Filbert, MD 09/09/17 [phone]

## 2017-09-09 NOTE — ED Notes (Signed)
Patient transported to 226. 

## 2017-09-09 NOTE — ED Triage Notes (Signed)
Says the pain radiates down to left leg

## 2017-09-10 ENCOUNTER — Inpatient Hospital Stay: Payer: BLUE CROSS/BLUE SHIELD

## 2017-09-10 LAB — CBC
HCT: 33.8 % — ABNORMAL LOW (ref 35.0–47.0)
Hemoglobin: 10.8 g/dL — ABNORMAL LOW (ref 12.0–16.0)
MCH: 23.3 pg — ABNORMAL LOW (ref 26.0–34.0)
MCHC: 32 g/dL (ref 32.0–36.0)
MCV: 72.9 fL — ABNORMAL LOW (ref 80.0–100.0)
PLATELETS: 217 10*3/uL (ref 150–440)
RBC: 4.63 MIL/uL (ref 3.80–5.20)
RDW: 17 % — AB (ref 11.5–14.5)
WBC: 5 10*3/uL (ref 3.6–11.0)

## 2017-09-10 LAB — BASIC METABOLIC PANEL
Anion gap: 6 (ref 5–15)
BUN: 16 mg/dL (ref 6–20)
CO2: 26 mmol/L (ref 22–32)
CREATININE: 1 mg/dL (ref 0.44–1.00)
Calcium: 9.3 mg/dL (ref 8.9–10.3)
Chloride: 104 mmol/L (ref 101–111)
GFR, EST NON AFRICAN AMERICAN: 56 mL/min — AB (ref >60)
Glucose, Bld: 102 mg/dL — ABNORMAL HIGH (ref 65–99)
Potassium: 4.2 mmol/L (ref 3.5–5.1)
Sodium: 136 mmol/L (ref 135–145)

## 2017-09-10 LAB — LIPASE, BLOOD: LIPASE: 96 U/L — AB (ref 11–51)

## 2017-09-10 LAB — GLUCOSE, CAPILLARY
GLUCOSE-CAPILLARY: 86 mg/dL (ref 65–99)
GLUCOSE-CAPILLARY: 122 mg/dL — AB (ref 65–99)
Glucose-Capillary: 109 mg/dL — ABNORMAL HIGH (ref 65–99)
Glucose-Capillary: 99 mg/dL (ref 65–99)

## 2017-09-10 MED ORDER — OXYCODONE HCL 5 MG PO TABS
5.0000 mg | ORAL_TABLET | ORAL | Status: DC | PRN
  Administered 2017-09-10: 5 mg via ORAL
  Filled 2017-09-10: qty 1

## 2017-09-10 NOTE — Progress Notes (Signed)
Patient ID: Sydney Howe, female   DOB: 09/01/1950, 67 y.o.   MRN: [id number]  [facility] PROGRESS NOTE  Gila Lauf NWG:[id number] DOB: 09/01/1950 DOA: 09/09/2017 PCP: Wallace Cullens, MD (Inactive)  HPI/Subjective: Patient complains of 8 out of 10 abdominal pain in the epigastric area.  No nausea or vomiting.  No diarrhea.  No shortness of breath or chest pain.  Objective: Vitals:   09/10/17 0421 09/10/17 1313  BP: (!) 100/51 (!) 98/59  Pulse: (!) 50 (!) 42  Resp: 16   Temp: 98.9 F (37.2 C) 98 F (36.7 C)  SpO2: 100% 97%    Filed Weights   09/09/17 1052 09/09/17 2032  Weight: 63.5 kg (140 lb) 67.4 kg (148 lb 9.4 oz)    ROS: Review of Systems  Constitutional: Negative for chills and fever.  Eyes: Negative for blurred vision.  Respiratory: Negative for cough and shortness of breath.   Cardiovascular: Negative for chest pain.  Gastrointestinal: Positive for abdominal pain. Negative for constipation, diarrhea, nausea and vomiting.  Genitourinary: Negative for dysuria.  Musculoskeletal: Negative for joint pain.  Neurological: Negative for dizziness and headaches.   Exam: Physical Exam  Constitutional: She is oriented to person, place, and time.  HENT:  Nose: No mucosal edema.  Mouth/Throat: No oropharyngeal exudate or posterior oropharyngeal edema.  Eyes: Pupils are equal, round, and reactive to light. Conjunctivae, EOM and lids are normal.  Neck: No JVD present. Carotid bruit is not present. No edema present. No thyroid mass and no thyromegaly present.  Cardiovascular: S1 normal and S2 normal. Bradycardia present. Exam reveals no gallop.  No murmur heard. Pulses:      Dorsalis pedis pulses are 2+ on the right side, and 2+ on the left side.  Respiratory: No respiratory distress. She has no wheezes. She has no rhonchi. She has no rales.  GI: Soft. Bowel sounds are normal. There is tenderness in the epigastric area.  Musculoskeletal:       Right ankle: She exhibits  no swelling.       Left ankle: She exhibits no swelling.  Lymphadenopathy:    She has no cervical adenopathy.  Neurological: She is alert and oriented to person, place, and time. No cranial nerve deficit.  Skin: Skin is warm. No rash noted. Nails show no clubbing.  Psychiatric: She has a normal mood and affect.      Data Reviewed: Basic Metabolic Panel: Recent Labs  Lab 09/09/17 1138 09/10/17 0531  NA 132* 136  K 4.3 4.2  CL 100* 104  CO2 27 26  GLUCOSE 105* 102*  BUN 22* 16  CREATININE 0.78 1.00  CALCIUM 9.7 9.3   Liver Function Tests: Recent Labs  Lab 09/09/17 1138  AST 24  ALT 16  ALKPHOS 48  BILITOT 0.5  PROT 8.4*  ALBUMIN 4.3   Recent Labs  Lab 09/09/17 1138 09/10/17 0531  LIPASE 224* 96*   CBC: Recent Labs  Lab 09/09/17 1138 09/10/17 0531  WBC 5.9 5.0  HGB 11.1* 10.8*  HCT 34.2* 33.8*  MCV 72.5* 72.9*  PLT 234 217   Cardiac Enzymes: Recent Labs  Lab 09/09/17 1138 09/09/17 1644  TROPONINI 0.03* <0.03    CBG: Recent Labs  Lab 09/09/17 2034 09/10/17 0731 09/10/17 1216  GLUCAP 106* 86 109*      Studies: Ct Abdomen Pelvis W Contrast  Result Date: 09/09/2017 CLINICAL DATA:  Generalized abdominal pain. EXAM: CT ABDOMEN AND PELVIS WITH CONTRAST TECHNIQUE: Multidetector CT imaging of the abdomen and pelvis  was performed using the standard protocol following bolus administration of intravenous contrast. CONTRAST:  75mL ISOVUE-370 IOPAMIDOL (ISOVUE-370) INJECTION 76% COMPARISON:  CT abdomen dated June 08, 2010. FINDINGS: Lower chest: No acute abnormality. Mild cardiomegaly. Bibasilar atelectasis. Hepatobiliary: Heterogeneous enhancement of the right liver resolves on the delayed phase and is likely perfusional. Unchanged hemangioma within the right liver. Subcentimeter low-density lesions within the right liver and caudate lobe remain too small to characterize, but are stable. Interval decrease in size of the 3.6 cm simple cyst in the far  left hepatic lobe in the left upper quadrant. No new focal liver abnormality. The gallbladder is unremarkable. Mild central and left intrahepatic biliary dilatation is unchanged. The common bile duct is mildly dilated, now measuring 11 mm, previously 7 mm. Pancreas: Mild inflammation surrounding the pancreatic head. Mild atrophy of the body and tail. No ductal dilatation. Spleen: Normal in size without focal abnormality. Adrenals/Urinary Tract: The adrenal glands are unremarkable. Subcentimeter low-density lesions in the right kidney are too small to characterize. No renal or ureteral calculi. No hydronephrosis. The bladder is unremarkable. Stomach/Bowel: The stomach is within normal limits. Mild wall thickening and minimal inflammatory stranding surrounding the second and third portion of the duodenum, likely reactive. No bowel obstruction. Mild left-sided colonic diverticulosis. Normal appendix. Vascular/Lymphatic: Aortic atherosclerosis. No enlarged abdominal or pelvic lymph nodes. Reproductive: Uterus and bilateral adnexa are unremarkable. Other: No abdominal wall hernia or abnormality. No abdominopelvic ascites. No pneumoperitoneum. Musculoskeletal: No acute or significant osseous findings. IMPRESSION: 1. Mild inflammation surrounding the pancreatic head likely reflects acute pancreatitis. No peripancreatic fluid collection. 2. Mild intra and extrahepatic biliary dilatation without clear obstructing lesion. Correlate with LFTs and consider further evaluation with MRCP or ERCP. 3.  Aortic atherosclerosis (ICD10-I70.0). Electronically Signed   By: Obie Dredge M.D.   On: 09/09/2017 15:47   US Abdomen Limited Ruq  Result Date: 09/10/2017 CLINICAL DATA:  Pancreatitis EXAM: ULTRASOUND ABDOMEN LIMITED RIGHT UPPER QUADRANT COMPARISON:  CT 09/09/2017 FINDINGS: Gallbladder: No gallstones or wall thickening visualized. No sonographic Murphy sign noted by sonographer. Common bile duct: Diameter: Mildly prominent at  8 mm.  No visible stones. Liver: Diffusely increased echotexture throughout the liver. Previously seen left hepatic cyst by CT not visible by ultrasound. Portal vein is patent on color Doppler imaging with normal direction of blood flow towards the liver. IMPRESSION: Fatty infiltration of the liver. Borderline prominence of the common bile duct, 8 mm. No visible ductal stones. Electronically Signed   By: Charlett Nose M.D.   On: 09/10/2017 10:12    Scheduled Meds: . allopurinol  300 mg Oral Daily  . enoxaparin (LOVENOX) injection  40 mg Subcutaneous Q24H  . insulin aspart  0-5 Units Subcutaneous QHS  . insulin aspart  0-9 Units Subcutaneous TID WC  . levothyroxine  50 mcg Oral QAC breakfast  . pantoprazole  40 mg Oral Daily   Continuous Infusions: . sodium chloride 100 mL/hr at 09/10/17 0820    Assessment/Plan:  1.  Acute pancreatitis.  Lipase trending in the normal range.  Still having pain.  Start liquid diet today.  Continue IV fluids.  The patient does not drink alcohol.  I will check triglycerides in the morning.  Ultrasound of the abdomen came back negative without gallstones.   2.  Type 2 diabetes mellitus on sliding scale only. 3   Bradycardia and relative hypotension.  Continue to hold antihypertensive medications. 4.  Hyponatremia improved with IV fluids. 5.  Gout on allopurinol   Code Status:  Code Status Orders  (From admission, onward)        Start     Ordered   09/09/17 2032  Full code  Continuous     09/09/17 2032    Code Status History    This patient has a current code status but no historical code status.     Disposition Plan: To be determined based on clinical course  Time spent: 30 minutes.   [place]  interpreter obtained to communicate with this patient.  Vaidehi Braddy [employer]

## 2017-09-10 NOTE — Plan of Care (Signed)
  Problem: Education: Goal: Knowledge of General Education information will improve Outcome: Progressing   Problem: Pain Managment: Goal: General experience of comfort will improve Outcome: Progressing   

## 2017-09-11 ENCOUNTER — Inpatient Hospital Stay: Payer: BLUE CROSS/BLUE SHIELD

## 2017-09-11 LAB — GLUCOSE, CAPILLARY
GLUCOSE-CAPILLARY: 93 mg/dL (ref 65–99)
GLUCOSE-CAPILLARY: 123 mg/dL — AB (ref 65–99)
GLUCOSE-CAPILLARY: 79 mg/dL (ref 65–99)
Glucose-Capillary: 80 mg/dL (ref 65–99)

## 2017-09-11 LAB — COMPREHENSIVE METABOLIC PANEL
ALBUMIN: 3.4 g/dL — AB (ref 3.5–5.0)
ALT: 12 U/L — ABNORMAL LOW (ref 14–54)
ANION GAP: 6 (ref 5–15)
AST: 18 U/L (ref 15–41)
Alkaline Phosphatase: 40 U/L (ref 38–126)
BILIRUBIN TOTAL: 0.6 mg/dL (ref 0.3–1.2)
BUN: 12 mg/dL (ref 6–20)
CO2: 23 mmol/L (ref 22–32)
Calcium: 9.1 mg/dL (ref 8.9–10.3)
Chloride: 107 mmol/L (ref 101–111)
Creatinine, Ser: 0.92 mg/dL (ref 0.44–1.00)
GFR calc Af Amer: >60 mL/min (ref >60)
GFR calc non Af Amer: >60 mL/min (ref >60)
GLUCOSE: 93 mg/dL (ref 65–99)
POTASSIUM: 4.1 mmol/L (ref 3.5–5.1)
Sodium: 136 mmol/L (ref 135–145)
TOTAL PROTEIN: 7 g/dL (ref 6.5–8.1)

## 2017-09-11 LAB — LIPASE, BLOOD: LIPASE: 105 U/L — AB (ref 11–51)

## 2017-09-11 LAB — TRIGLYCERIDES: Triglycerides: 92 mg/dL (ref <150)

## 2017-09-11 MED ORDER — GADOBENATE DIMEGLUMINE 529 MG/ML IV SOLN
13.0000 mL | Freq: Once | INTRAVENOUS | Status: AC | PRN
  Administered 2017-09-11: 13 mL via INTRAVENOUS

## 2017-09-11 NOTE — Progress Notes (Signed)
Patient ID: Sydney Howe, female   DOB: 09/01/1950, 67 y.o.   MRN: [id number]  [facility] PROGRESS NOTE  Jaelle Campanile BJY:[id number] DOB: 09/01/1950 DOA: 09/09/2017 PCP: Wallace Cullens, MD (Inactive)  HPI/Subjective: Patient's daughter is at the bedside.  She is able to communicate better with her daughter who is at the bedside secondary to her having hearing issues.  She could not understand the translator yesterday.  Patient complains about 5 out of 10 abdominal pain.  Comes and goes.  Objective: Vitals:   09/11/17 0512 09/11/17 1256  BP: (!) 103/49 104/60  Pulse: (!) 43 (!) 41  Resp: 18   Temp: 98.2 F (36.8 C) (!) 97.5 F (36.4 C)  SpO2: 98% 98%    Filed Weights   09/09/17 1052 09/09/17 2032  Weight: 63.5 kg (140 lb) 67.4 kg (148 lb 9.4 oz)    ROS: Review of Systems  Constitutional: Negative for chills and fever.  Eyes: Negative for blurred vision.  Respiratory: Negative for cough and shortness of breath.   Cardiovascular: Negative for chest pain.  Gastrointestinal: Positive for abdominal pain. Negative for constipation, diarrhea, nausea and vomiting.  Genitourinary: Negative for dysuria.  Musculoskeletal: Negative for joint pain.  Neurological: Negative for dizziness and headaches.   Exam: Physical Exam  Constitutional: She is oriented to person, place, and time.  HENT:  Nose: No mucosal edema.  Mouth/Throat: No oropharyngeal exudate or posterior oropharyngeal edema.  Eyes: Pupils are equal, round, and reactive to light. Conjunctivae, EOM and lids are normal.  Neck: No JVD present. Carotid bruit is not present. No edema present. No thyroid mass and no thyromegaly present.  Cardiovascular: S1 normal and S2 normal. Bradycardia present. Exam reveals no gallop.  No murmur heard. Pulses:      Dorsalis pedis pulses are 2+ on the right side, and 2+ on the left side.  Respiratory: No respiratory distress. She has no wheezes. She has no rhonchi. She has no rales.  GI:  Soft. Bowel sounds are normal. There is tenderness in the epigastric area.  Musculoskeletal:       Right ankle: She exhibits no swelling.       Left ankle: She exhibits no swelling.  Lymphadenopathy:    She has no cervical adenopathy.  Neurological: She is alert and oriented to person, place, and time. No cranial nerve deficit.  Skin: Skin is warm. No rash noted. Nails show no clubbing.  Psychiatric: She has a normal mood and affect.      Data Reviewed: Basic Metabolic Panel: Recent Labs  Lab 09/09/17 1138 09/10/17 0531 09/11/17 0523  NA 132* 136 136  K 4.3 4.2 4.1  CL 100* 104 107  CO2 GLUCOSE 105* 102* 93  BUN 22* 16 12  CREATININE 0.78 1.00 0.92  CALCIUM 9.7 9.3 9.1   Liver Function Tests: Recent Labs  Lab 09/09/17 1138 09/11/17 0523  AST 24 18  ALT 16 12*  ALKPHOS 48 40  BILITOT 0.5 0.6  PROT 8.4* 7.0  ALBUMIN 4.3 3.4*   Recent Labs  Lab 09/09/17 1138 09/10/17 0531 09/11/17 0523  LIPASE 224* 96* 105*   CBC: Recent Labs  Lab 09/09/17 1138 09/10/17 0531  WBC 5.9 5.0  HGB 11.1* 10.8*  HCT 34.2* 33.8*  MCV 72.5* 72.9*  PLT 234 217   Cardiac Enzymes: Recent Labs  Lab 09/09/17 1138 09/09/17 1644  TROPONINI 0.03* <0.03    CBG: Recent Labs  Lab 09/10/17 1216 09/10/17 1647 09/10/17 2058 09/11/17  0735 09/11/17 1257  GLUCAP 109* 99 122* 80 93      Studies: Ct Abdomen Pelvis W Contrast  Result Date: 09/09/2017 CLINICAL DATA:  Generalized abdominal pain. EXAM: CT ABDOMEN AND PELVIS WITH CONTRAST TECHNIQUE: Multidetector CT imaging of the abdomen and pelvis was performed using the standard protocol following bolus administration of intravenous contrast. CONTRAST:  75mL ISOVUE-370 IOPAMIDOL (ISOVUE-370) INJECTION 76% COMPARISON:  CT abdomen dated June 08, 2010. FINDINGS: Lower chest: No acute abnormality. Mild cardiomegaly. Bibasilar atelectasis. Hepatobiliary: Heterogeneous enhancement of the right liver resolves on the delayed  phase and is likely perfusional. Unchanged hemangioma within the right liver. Subcentimeter low-density lesions within the right liver and caudate lobe remain too small to characterize, but are stable. Interval decrease in size of the 3.6 cm simple cyst in the far left hepatic lobe in the left upper quadrant. No new focal liver abnormality. The gallbladder is unremarkable. Mild central and left intrahepatic biliary dilatation is unchanged. The common bile duct is mildly dilated, now measuring 11 mm, previously 7 mm. Pancreas: Mild inflammation surrounding the pancreatic head. Mild atrophy of the body and tail. No ductal dilatation. Spleen: Normal in size without focal abnormality. Adrenals/Urinary Tract: The adrenal glands are unremarkable. Subcentimeter low-density lesions in the right kidney are too small to characterize. No renal or ureteral calculi. No hydronephrosis. The bladder is unremarkable. Stomach/Bowel: The stomach is within normal limits. Mild wall thickening and minimal inflammatory stranding surrounding the second and third portion of the duodenum, likely reactive. No bowel obstruction. Mild left-sided colonic diverticulosis. Normal appendix. Vascular/Lymphatic: Aortic atherosclerosis. No enlarged abdominal or pelvic lymph nodes. Reproductive: Uterus and bilateral adnexa are unremarkable. Other: No abdominal wall hernia or abnormality. No abdominopelvic ascites. No pneumoperitoneum. Musculoskeletal: No acute or significant osseous findings. IMPRESSION: 1. Mild inflammation surrounding the pancreatic head likely reflects acute pancreatitis. No peripancreatic fluid collection. 2. Mild intra and extrahepatic biliary dilatation without clear obstructing lesion. Correlate with LFTs and consider further evaluation with MRCP or ERCP. 3.  Aortic atherosclerosis (ICD10-I70.0). Electronically Signed   By: Obie Dredge M.D.   On: 09/09/2017 15:47   US Abdomen Limited Ruq  Result Date: 09/10/2017 CLINICAL  DATA:  Pancreatitis EXAM: ULTRASOUND ABDOMEN LIMITED RIGHT UPPER QUADRANT COMPARISON:  CT 09/09/2017 FINDINGS: Gallbladder: No gallstones or wall thickening visualized. No sonographic Murphy sign noted by sonographer. Common bile duct: Diameter: Mildly prominent at 8 mm.  No visible stones. Liver: Diffusely increased echotexture throughout the liver. Previously seen left hepatic cyst by CT not visible by ultrasound. Portal vein is patent on color Doppler imaging with normal direction of blood flow towards the liver. IMPRESSION: Fatty infiltration of the liver. Borderline prominence of the common bile duct, 8 mm. No visible ductal stones. Electronically Signed   By: Charlett Nose M.D.   On: 09/10/2017 10:12    Scheduled Meds: . allopurinol  300 mg Oral Daily  . enoxaparin (LOVENOX) injection  40 mg Subcutaneous Q24H  . insulin aspart  0-5 Units Subcutaneous QHS  . insulin aspart  0-9 Units Subcutaneous TID WC  . levothyroxine  50 mcg Oral QAC breakfast  . pantoprazole  40 mg Oral Daily   Continuous Infusions: . sodium chloride 100 mL/hr at 09/11/17 0454    Assessment/Plan:  1.  Acute pancreatitis.  Lipase little bit higher than yesterday.  Still having pain.   n.p.o.  MRCP ordered for today..  Continue IV fluids.  The patient does not drink alcohol.   Triglycerides normal.  This could be secondary  to lisinopril.  Ultrasound of the abdomen came back negative without gallstones.   2.  Type 2 diabetes mellitus on sliding scale only. 3   Bradycardia and relative hypotension.  Continue to hold antihypertensive medications. 4.  Hyponatremia improved with IV fluids. 5.  Gout on allopurinol   Code Status:     Code Status Orders  (From admission, onward)        Start     Ordered   09/09/17 2032  Full code  Continuous     09/09/17 2032    Code Status History    This patient has a current code status but no historical code status.     Disposition Plan:  reevaluate tomorrow.  Time spent:  26 minutes.  Patient's daughter able to translate better with the patient secondary to the patient's hearing issues.  She was not able to understand the translator very well yesterday.  Dominick Zertuche [employer]

## 2017-09-12 LAB — COMPREHENSIVE METABOLIC PANEL
ALT: 12 U/L — ABNORMAL LOW (ref 14–54)
AST: 18 U/L (ref 15–41)
Albumin: 3.4 g/dL — ABNORMAL LOW (ref 3.5–5.0)
Alkaline Phosphatase: 38 U/L (ref 38–126)
Anion gap: 7 (ref 5–15)
BILIRUBIN TOTAL: 0.8 mg/dL (ref 0.3–1.2)
BUN: 10 mg/dL (ref 6–20)
CHLORIDE: 108 mmol/L (ref 101–111)
CO2: 23 mmol/L (ref 22–32)
Calcium: 9.1 mg/dL (ref 8.9–10.3)
Creatinine, Ser: 0.91 mg/dL (ref 0.44–1.00)
Glucose, Bld: 92 mg/dL (ref 65–99)
POTASSIUM: 3.9 mmol/L (ref 3.5–5.1)
Sodium: 138 mmol/L (ref 135–145)
TOTAL PROTEIN: 7 g/dL (ref 6.5–8.1)

## 2017-09-12 LAB — GLUCOSE, CAPILLARY
GLUCOSE-CAPILLARY: 87 mg/dL (ref 65–99)
Glucose-Capillary: 141 mg/dL — ABNORMAL HIGH (ref 65–99)

## 2017-09-12 LAB — LIPASE, BLOOD: LIPASE: 107 U/L — AB (ref 11–51)

## 2017-09-12 MED ORDER — ACETAMINOPHEN 325 MG PO TABS: 650.0000 mg | ORAL_TABLET | Freq: Four times a day (QID) | ORAL | Status: AC | PRN

## 2017-09-12 MED ORDER — OXYCODONE HCL 5 MG PO TABS: 5.0000 mg | ORAL_TABLET | Freq: Four times a day (QID) | ORAL | 0 refills | Status: AC | PRN

## 2017-09-12 NOTE — Progress Notes (Signed)
Sydney Howe to be D/C'd Home per MD order.  Discussed prescriptions and follow up appointments with the patient. Prescriptions given to patient, medication list explained in detail. Pt verbalized understanding.  Allergies as of 09/12/2017   No Known Allergies     Medication List    STOP taking these medications   lisinopril 20 MG tablet Commonly known as:  PRINIVIL,ZESTRIL     TAKE these medications   acetaminophen 325 MG tablet Commonly known as:  TYLENOL Take 2 tablets (650 mg total) by mouth every 6 (six) hours as needed for mild pain or moderate pain (or Fever >/= 101).   allopurinol 300 MG tablet Commonly known as:  ZYLOPRIM Take 1 tablet by mouth daily.   levothyroxine 50 MCG tablet Commonly known as:  SYNTHROID, LEVOTHROID Take 50 mcg by mouth daily before breakfast.   metFORMIN 500 MG tablet Commonly known as:  GLUCOPHAGE Take 1 tablet by mouth 2 (two) times daily.   oxyCODONE 5 MG immediate release tablet Commonly known as:  Oxy IR/ROXICODONE Take 1 tablet (5 mg total) by mouth every 6 (six) hours as needed for severe pain.   PRILOSEC OTC 20 MG tablet Generic drug:  omeprazole Take 1 tablet by mouth daily.       Vitals:   09/12/17 0446 09/12/17 1156  BP: (!) 107/46 (!) 105/47  Pulse: (!) 40 (!) 48  Resp: 16   Temp: 98.4 F (36.9 C) 98.3 F (36.8 C)  SpO2: 99% 95%    Skin clean, dry and intact without evidence of skin break down, no evidence of skin tears noted. IV catheter discontinued intact. Site without signs and symptoms of complications. Dressing and pressure applied. Pt denies pain at this time. No complaints noted.   An After Visit Summary was printed and given to the patient. Educated pt and son. Pt didn't want to use ipad interpreter due to having a hard time understanding. Stated she wanted me to go over everything with her son. Patient escorted via WC, and D/C home via private auto.  [name] Patient

## 2017-09-12 NOTE — Discharge Instructions (Signed)
Acute Pancreatitis Acute pancreatitis is a condition in which the pancreas suddenly gets irritated and swollen (has inflammation). The pancreas is a large gland behind the stomach. It makes enzymes that help to digest food. The pancreas also makes hormones that help to control your blood sugar. Acute pancreatitis happens when the enzymes attack the pancreas and damage it. Most attacks last a couple of days and can cause serious problems. Follow these instructions at home: Eating and drinking  Follow instructions from your doctor about diet. You may need to: ? Avoid alcohol. ? Limit how much fat is in your diet.  Eat small meals often. Avoid eating big meals.  Drink enough fluid to keep your pee (urine) clear or pale yellow.  Do not drink alcohol if it caused your condition. General instructions  Take over-the-counter and prescription medicines only as told by your doctor.  Do not use any tobacco products. These include cigarettes, chewing tobacco, and e-cigarettes. If you need help quitting, ask your doctor.  Get plenty of rest.  If directed, check your blood sugar at home as told by your doctor.  Keep all follow-up visits as told by your doctor. This is important. Contact a doctor if:  You do not get better as quickly as expected.  You have new symptoms.  Your symptoms get worse.  You have lasting pain or weakness.  You continue to feel sick to your stomach (nauseous).  You get better and then you have another pain attack.  You have a fever. Get help right away if:  You cannot eat or keep fluids down.  Your pain becomes very bad.  Your skin or the white part of your eyes turns yellow (jaundice).  You throw up (vomit).  You feel dizzy or you pass out (faint).  Your blood sugar is high (over 300 mg/dL). This information is not intended to replace advice given to you by your health care provider. Make sure you discuss any questions you have with your health care  provider. Document Released: 10/02/2007 Document Revised: 09/21/2015 Document Reviewed: 01/17/2015 [employer]vier Interactive Patient Education  2018 [employer]  

## 2017-09-12 NOTE — Discharge Summary (Signed)
[facility] - [place] [facility]   PATIENT NAME: Sydney Howe    MR#:  [id number]  DATE OF BIRTH:  09/01/1950  DATE OF ADMISSION:  09/09/2017 ADMITTING PHYSICIAN: Shaune Pollack, MD  DATE OF DISCHARGE: 09/12/2017  PRIMARY CARE PHYSICIAN: Dr. Burnadette Pop    ADMISSION DIAGNOSIS:  Idiopathic acute pancreatitis without infection or necrosis [K85.00]  DISCHARGE DIAGNOSIS:  Active Problems:   Acute pancreatitis   SECONDARY DIAGNOSIS:   Past Medical History:  Diagnosis Date  . Acquired hypothyroidism   . Chronic hepatitis B (HCC)   . Diabetes mellitus without complication (HCC)   . Diet-controlled diabetes mellitus (HCC)   . GERD (gastroesophageal reflux disease)   . Graves disease   . Hypertension   . Hyperuricemia   . Rheumatoid factor positive     HOSPITAL COURSE:   1.  Acute pancreatitis.  Patient came in with abdominal pain and had an elevated lipase.  The patient's pain has settled down upon discharge and was tolerating solid food.  The patient's lipase was still little borderline but not having pain.  The patient does not drink alcohol.  Her triglycerides are normal.  Imaging study of the gallbladder including CAT scan, ultrasound and MRCP did not conclusively show that the gallbladder was the cause.  I think the patient's lisinopril is the cause.  Lisinopril was held. 2.  Type 2 diabetes mellitus patient on sliding scale while here.  Can go back on metformin as outpatient 3.  Bradycardia and relative hypotension.  Continue to hold antihypertensive medications. 4.  Hyponatremia improved with IV fluids 5.  Gout on allopurinol  DISCHARGE CONDITIONS:   Satisfactory  CONSULTS OBTAINED:  None  DRUG ALLERGIES:  No Known Allergies  DISCHARGE MEDICATIONS:   Allergies as of 09/12/2017   No Known Allergies     Medication List    STOP taking these medications   lisinopril 20 MG tablet Commonly known as:  PRINIVIL,ZESTRIL     TAKE these medications    acetaminophen 325 MG tablet Commonly known as:  TYLENOL Take 2 tablets (650 mg total) by mouth every 6 (six) hours as needed for mild pain or moderate pain (or Fever >/= 101).   allopurinol 300 MG tablet Commonly known as:  ZYLOPRIM Take 1 tablet by mouth daily.   levothyroxine 50 MCG tablet Commonly known as:  SYNTHROID, LEVOTHROID Take 50 mcg by mouth daily before breakfast.   metFORMIN 500 MG tablet Commonly known as:  GLUCOPHAGE Take 1 tablet by mouth 2 (two) times daily.   oxyCODONE 5 MG immediate release tablet Commonly known as:  Oxy IR/ROXICODONE Take 1 tablet (5 mg total) by mouth every 6 (six) hours as needed for severe pain.   PRILOSEC OTC 20 MG tablet Generic drug:  omeprazole Take 1 tablet by mouth daily.        DISCHARGE INSTRUCTIONS:   Follow-up PMD 6 days Can follow-up with general surgery at the [facility] clinic if pancreatitis happens again then can consider taking out the gallbladder  If you experience worsening of your admission symptoms, develop shortness of breath, life threatening emergency, suicidal or homicidal thoughts you must seek medical attention immediately by calling 911 or calling your MD immediately  if symptoms less severe.  You Must read complete instructions/literature along with all the possible adverse reactions/side effects for all the Medicines you take and that have been prescribed to you. Take any new Medicines after you have completely understood and accept all the possible adverse reactions/side effects.  Please note  You were cared for by a hospitalist during your hospital stay. If you have any questions about your discharge medications or the care you received while you were in the hospital after you are discharged, you can call the unit and asked to speak with the hospitalist on call if the hospitalist that took care of you is not available. Once you are discharged, your primary care physician will handle any further medical  issues. Please note that NO REFILLS for any discharge medications will be authorized once you are discharged, as it is imperative that you return to your primary care physician (or establish a relationship with a primary care physician if you do not have one) for your aftercare needs so that they can reassess your need for medications and monitor your lab values.    Today   CHIEF COMPLAINT:   Chief Complaint  Patient presents with  . Abdominal Pain    HISTORY OF PRESENT ILLNESS:  Sydney Howe  is a 67 y.o. female presented with abdominal pain   VITAL SIGNS:  Blood pressure (!) 105/47, pulse (!) 48, temperature 98.3 F (36.8 C), temperature source Oral, resp. rate 16, height  (1.626 m), weight 67.4 kg (148 lb 9.4 oz), SpO2 95 %.    PHYSICAL EXAMINATION:  GENERAL:  67 y.o.-year-old patient lying in the bed with no acute distress.  EYES: Pupils equal, round, reactive to light and accommodation. No scleral icterus. Extraocular muscles intact.  HEENT: Head atraumatic, normocephalic. Oropharynx and nasopharynx clear.  NECK:  Supple, no jugular venous distention. No thyroid enlargement, no tenderness.  LUNGS: Normal breath sounds bilaterally, no wheezing, rales,rhonchi or crepitation. No use of accessory muscles of respiration.  CARDIOVASCULAR: S1, S2 normal. No murmurs, rubs, or gallops.  ABDOMEN: Soft, non-tender, non-distended. Bowel sounds present. No organomegaly or mass.  EXTREMITIES: No pedal edema, cyanosis, or clubbing.  NEUROLOGIC: Cranial nerves II through XII are intact. Muscle strength 5/5 in all extremities. Sensation intact. Gait not checked.  PSYCHIATRIC: The patient is alert and oriented x 3.  SKIN: No obvious rash, lesion, or ulcer.   DATA REVIEW:   CBC Recent Labs  Lab 09/10/17 0531  WBC 5.0  HGB 10.8*  HCT 33.8*  PLT 217    Chemistries  Recent Labs  Lab 09/12/17 0543  NA 138  K 3.9  CL 108  CO2 23  GLUCOSE 92  BUN 10  CREATININE 0.91   CALCIUM 9.1  AST 18  ALT 12*  ALKPHOS 38  BILITOT 0.8    Cardiac Enzymes Recent Labs  Lab 09/09/17 1644  TROPONINI <0.03      RADIOLOGY:  Mr 3d Recon At Scanner  Result Date: 09/11/2017 CLINICAL DATA:  Evaluate pancreatitis.  Etiology indeterminate EXAM: MRI ABDOMEN WITHOUT AND WITH CONTRAST (INCLUDING MRCP) TECHNIQUE: Multiplanar multisequence MR imaging of the abdomen was performed both before and after the administration of intravenous contrast. Heavily T2-weighted images of the biliary and pancreatic ducts were obtained, and three-dimensional MRCP images were rendered by post processing. CONTRAST:  13mL MULTIHANCE GADOBENATE DIMEGLUMINE 529 MG/ML IV SOLN COMPARISON:  CT AP 09/09/2017. FINDINGS: Exam detail is diminished due to motion artifact. Lower chest: No acute findings. Hepatobiliary: Diffuse hepatic steatosis. Within the limitations of motion artifact the gallbladder appears normal. No gallstones identified. The common bile duct is mildly ectatic measuring up to 11 mm. No findings of choledocholithiasis identified. Scattered T2 hyperintensities are identified within both lobes of liver. 1.2 cm lesion within segment 5 has enhancement  characteristics favoring a benign hemangioma. The lesion within segment 7 measures 1 cm, image 9/7. This also has enhancement characteristics favorable for benign hemangioma. Large T2 hyperintense structure within the lateral segment of left lobe of liver measures 3.1 cm and likely represents a simple cyst. Other millimetric T2 hyperintense foci within both lobes of liver probably represent small cysts. Pancreas: Evaluation of the pancreas is significantly limited due to motion artifact. From the neck through the tail of pancreas there is diffuse chronic appearing parenchymal volume loss. No main duct dilatation identified. Postcontrast imaging of the pancreas is significantly diminished due to motion artifact. No discrete mass or focal fluid collections  identified. Spleen:  Unremarkable appearance of the spleen. Adrenals/Urinary Tract:  The adrenal glands are normal. No kidney mass or hydronephrosis identified. Stomach/Bowel: Visualized portions within the abdomen are unremarkable. Vascular/Lymphatic: Aortic atherosclerosis. No aneurysm. No upper abdominal adenopathy. Other:  No ascites or focal fluid collections. Musculoskeletal: No suspicious bone lesions identified. IMPRESSION: 1. Significantly diminished exam detail secondary to motion artifact. 2. Within the limitations of motion artifact there are no complications from pancreatitis. No pancreatic necrosis or pseudocyst identified. 3. Multiple T2 hyperintense lesions within the liver are difficult to characterize due to motion artifact but are favored to represent a combination of multiple cysts and benign hemangiomas. 4. Hepatic steatosis 5.  Aortic Atherosclerosis (ICD10-I70.0). Electronically Signed   By: Signa Kell M.D.   On: 09/11/2017 14:55   Mr Abdomen Mrcp Vivien Rossetti Contast  Result Date: 09/11/2017 CLINICAL DATA:  Evaluate pancreatitis.  Etiology indeterminate EXAM: MRI ABDOMEN WITHOUT AND WITH CONTRAST (INCLUDING MRCP) TECHNIQUE: Multiplanar multisequence MR imaging of the abdomen was performed both before and after the administration of intravenous contrast. Heavily T2-weighted images of the biliary and pancreatic ducts were obtained, and three-dimensional MRCP images were rendered by post processing. CONTRAST:  13mL MULTIHANCE GADOBENATE DIMEGLUMINE 529 MG/ML IV SOLN COMPARISON:  CT AP 09/09/2017. FINDINGS: Exam detail is diminished due to motion artifact. Lower chest: No acute findings. Hepatobiliary: Diffuse hepatic steatosis. Within the limitations of motion artifact the gallbladder appears normal. No gallstones identified. The common bile duct is mildly ectatic measuring up to 11 mm. No findings of choledocholithiasis identified. Scattered T2 hyperintensities are identified within both lobes  of liver. 1.2 cm lesion within segment 5 has enhancement characteristics favoring a benign hemangioma. The lesion within segment 7 measures 1 cm, image 9/7. This also has enhancement characteristics favorable for benign hemangioma. Large T2 hyperintense structure within the lateral segment of left lobe of liver measures 3.1 cm and likely represents a simple cyst. Other millimetric T2 hyperintense foci within both lobes of liver probably represent small cysts. Pancreas: Evaluation of the pancreas is significantly limited due to motion artifact. From the neck through the tail of pancreas there is diffuse chronic appearing parenchymal volume loss. No main duct dilatation identified. Postcontrast imaging of the pancreas is significantly diminished due to motion artifact. No discrete mass or focal fluid collections identified. Spleen:  Unremarkable appearance of the spleen. Adrenals/Urinary Tract:  The adrenal glands are normal. No kidney mass or hydronephrosis identified. Stomach/Bowel: Visualized portions within the abdomen are unremarkable. Vascular/Lymphatic: Aortic atherosclerosis. No aneurysm. No upper abdominal adenopathy. Other:  No ascites or focal fluid collections. Musculoskeletal: No suspicious bone lesions identified. IMPRESSION: 1. Significantly diminished exam detail secondary to motion artifact. 2. Within the limitations of motion artifact there are no complications from pancreatitis. No pancreatic necrosis or pseudocyst identified. 3. Multiple T2 hyperintense lesions within the liver are difficult  to characterize due to motion artifact but are favored to represent a combination of multiple cysts and benign hemangiomas. 4. Hepatic steatosis 5.  Aortic Atherosclerosis (ICD10-I70.0). Electronically Signed   By: Signa Kell M.D.   On: 09/11/2017 14:55      Management plans discussed with the patient, family and they are in agreement.  CODE STATUS:     Code Status Orders  (From admission,  onward)        Start     Ordered   09/09/17 2032  Full code  Continuous     09/09/17 2032    Code Status History    This patient has a current code status but no historical code status.      TOTAL TIME TAKING CARE OF THIS PATIENT: 35 minutes.    Alford Highland M.D on 09/12/2017 at 3:12 PM  Between 7am to 6pm - Pager - [phone]  After 6pm go to [email or web address] - password [email or web address]  [facility] Office  [phone]  CC: Primary care physician; Dr. Burnadette Pop

## 2018-04-10 ENCOUNTER — Other Ambulatory Visit: Payer: Self-pay | Admitting: Family Medicine

## 2018-04-10 DIAGNOSIS — Z1231 Encounter for screening mammogram for malignant neoplasm of breast: Secondary | ICD-10-CM

## 2018-05-11 ENCOUNTER — Ambulatory Visit
Admission: RE | Admit: 2018-05-11 | Discharge: 2018-05-11 | Disposition: A | Discharge status: Home / Self Care | Payer: Self-pay | Source: Ambulatory Visit | Attending: Family Medicine | Admitting: Family Medicine

## 2018-05-11 DIAGNOSIS — Z1231 Encounter for screening mammogram for malignant neoplasm of breast: Secondary | ICD-10-CM | POA: Insufficient documentation

## 2018-12-25 ENCOUNTER — Emergency Department
Admission: EM | Admit: 2018-12-25 | Discharge: 2018-12-25 | Disposition: A | Discharge status: Home / Self Care | Payer: Medicare Other | Attending: Emergency Medicine | Admitting: Emergency Medicine

## 2018-12-25 ENCOUNTER — Other Ambulatory Visit: Payer: Self-pay

## 2018-12-25 ENCOUNTER — Encounter: Payer: Self-pay | Admitting: Emergency Medicine

## 2018-12-25 DIAGNOSIS — E039 Hypothyroidism, unspecified: Secondary | ICD-10-CM | POA: Diagnosis not present

## 2018-12-25 DIAGNOSIS — Z79899 Other long term (current) drug therapy: Secondary | ICD-10-CM | POA: Diagnosis not present

## 2018-12-25 DIAGNOSIS — T63484A Toxic effect of venom of other arthropod, undetermined, initial encounter: Secondary | ICD-10-CM | POA: Diagnosis not present

## 2018-12-25 DIAGNOSIS — E119 Type 2 diabetes mellitus without complications: Secondary | ICD-10-CM | POA: Diagnosis not present

## 2018-12-25 DIAGNOSIS — I10 Essential (primary) hypertension: Secondary | ICD-10-CM | POA: Insufficient documentation

## 2018-12-25 DIAGNOSIS — Z7984 Long term (current) use of oral hypoglycemic drugs: Secondary | ICD-10-CM | POA: Insufficient documentation

## 2018-12-25 DIAGNOSIS — T7840XA Allergy, unspecified, initial encounter: Secondary | ICD-10-CM | POA: Insufficient documentation

## 2018-12-25 DIAGNOSIS — IMO0001 Reserved for inherently not codable concepts without codable children: Secondary | ICD-10-CM

## 2018-12-25 MED ORDER — PREDNISONE 20 MG PO TABS: 60.0000 mg | ORAL_TABLET | Freq: Every day | ORAL | 0 refills | Status: AC

## 2018-12-25 MED ORDER — DIPHENHYDRAMINE HCL 50 MG/ML IJ SOLN
50.0000 mg | Freq: Once | INTRAMUSCULAR | Status: AC
  Administered 2018-12-25: 14:00:00 50 mg via INTRAVENOUS
  Filled 2018-12-25: qty 1

## 2018-12-25 MED ORDER — EPINEPHRINE 0.3 MG/0.3ML IJ SOAJ: 0.3000 mg | Freq: Once | INTRAMUSCULAR | 0 refills | Status: AC

## 2018-12-25 MED ORDER — DIPHENHYDRAMINE HCL 25 MG PO TABS: 50.0000 mg | ORAL_TABLET | Freq: Four times a day (QID) | ORAL | 0 refills | Status: AC | PRN

## 2018-12-25 MED ORDER — FAMOTIDINE 20 MG PO TABS: 20.0000 mg | ORAL_TABLET | Freq: Two times a day (BID) | ORAL | 0 refills | Status: AC

## 2018-12-25 MED ORDER — FAMOTIDINE IN NACL 20-0.9 MG/50ML-% IV SOLN
20.0000 mg | Freq: Once | INTRAVENOUS | Status: AC
  Administered 2018-12-25: 20 mg via INTRAVENOUS
  Filled 2018-12-25: qty 50

## 2018-12-25 MED ORDER — METHYLPREDNISOLONE SODIUM SUCC 125 MG IJ SOLR
125.0000 mg | Freq: Once | INTRAMUSCULAR | Status: AC
  Administered 2018-12-25: 14:00:00 125 mg via INTRAVENOUS
  Filled 2018-12-25: qty 2

## 2018-12-25 NOTE — ED Triage Notes (Signed)
Through telephonic [place] interpreter, patient states she went to pick up bamboo and got stung by bees 10 times.  Patient reports stings to her hands and legs.  Patient reports chest pain and slight shortness of breath.  Patient is speaking in full sentences and maintaining secretions.

## 2018-12-25 NOTE — Discharge Instructions (Addendum)
1.  Take the Pepcid until it is gone.  As directed 2 take the prednisone until it is gone as directed 3.  Use Benadryl as directed as needed for itching 4.  If you have severe shortness of breath, significant swelling in your throat or tongue or you feel otherwise deathly ill from an allergic reaction, use the EpiPen and then call 911.  Do not use it unless you have the symptoms.

## 2018-12-25 NOTE — ED Notes (Signed)
Patient resting quietly.  No obvious distress.

## 2018-12-25 NOTE — ED Notes (Signed)
MD in room to re-assess patient.  Will continue to monitor.   

## 2018-12-25 NOTE — ED Notes (Signed)
Patient states she put, "monkey balm" on her stings.  There is a smell of menthol and possibly eucalyptus.

## 2018-12-25 NOTE — ED Provider Notes (Addendum)
[facility] Emergency Department Provider Note  ____________________________________________   I have reviewed the triage vital signs and the nursing notes. Where available I have reviewed prior notes and, if possible and indicated, outside hospital notes.   Patient seen and evaluated during the coronavirus epidemic during a time with low staffing  Patient seen for the symptoms described in the history of present illness. She was evaluated in the context of the global COVID-19 pandemic, which necessitated consideration that the patient might be at risk for infection with the SARS-CoV-2 virus that causes COVID-19. Institutional protocols and algorithms that pertain to the evaluation of patients at risk for COVID-19 are in a state of rapid change based on information released by regulatory bodies including the CDC and federal and state organizations. These policies and algorithms were followed during the patient's care in the ED.    HISTORY  Chief Complaint No chief complaint on file.    HPI Sydney Howe is a 68 y.o. female  Who family reports has no allergy, has limited English, presents today after getting stung by several yellow jackets had swelling to the right hand a little bit to the left arm, as well as around her eyes.  She is not having breathing difficulty.  She denies chest pain.  She states that she has no tongue swelling.  She has not had a reaction before.     Past Medical History:  Diagnosis Date  . Acquired hypothyroidism   . Chronic hepatitis B (HCC)   . Diabetes mellitus without complication (HCC)   . Diet-controlled diabetes mellitus (HCC)   . GERD (gastroesophageal reflux disease)   . Graves disease   . Hypertension   . Hyperuricemia   . Rheumatoid factor positive     Patient Active Problem List   Diagnosis Date Noted  . Acute pancreatitis 09/09/2017    Past Surgical History:  Procedure Laterality Date  .  ESOPHAGOGASTRODUODENOSCOPY (EGD) WITH PROPOFOL N/A 12/13/2014   Procedure: ESOPHAGOGASTRODUODENOSCOPY (EGD) WITH PROPOFOL;  Surgeon: Wallace Cullens, MD;  Location: [facility] ENDOSCOPY;  Service: Gastroenterology;  Laterality: N/A;    Prior to Admission medications   Medication Sig Start Date End Date Taking? Authorizing Provider  acetaminophen (TYLENOL) 325 MG tablet Take 2 tablets (650 mg total) by mouth every 6 (six) hours as needed for mild pain or moderate pain (or Fever >/= 101). 09/12/17   Alford Highland, MD  allopurinol (ZYLOPRIM) 300 MG tablet Take 1 tablet by mouth daily. 03/04/17   [provider]  levothyroxine (SYNTHROID, LEVOTHROID) 50 MCG tablet Take 50 mcg by mouth daily before breakfast.     [provider]  metFORMIN (GLUCOPHAGE) 500 MG tablet Take 1 tablet by mouth 2 (two) times daily.    [provider]  omeprazole (PRILOSEC OTC) 20 MG tablet Take 1 tablet by mouth daily. 09/04/17   [provider]  oxyCODONE (OXY IR/ROXICODONE) 5 MG immediate release tablet Take 1 tablet (5 mg total) by mouth every 6 (six) hours as needed for severe pain. 09/12/17   Alford Highland, MD    Allergies Patient has no known allergies.  Family History  Problem Relation Age of Onset  . Kidney disease Neg Hx     Social History   Review of Systems History somewhat limited by patient's language, given emergent situation I initially used family translator Constitutional: No fever/chills Eyes: No visual changes. ENT: No sore throat. No stiff neck no neck pain Cardiovascular: Denies chest pain. Respiratory: Denies shortness of breath.  Gastrointestinal:   no vomiting.  No diarrhea.  No constipation. Genitourinary: Negative for dysuria. Musculoskeletal: Negative lower extremity swelling Skin: See HPI. Neurological: Negative for severe headaches, focal weakness or numbness.   ____________________________________________   PHYSICAL EXAM:  VITAL SIGNS: ED Triage  Vitals  Enc Vitals Group     BP      Pulse      Resp      Temp      Temp src      SpO2      Weight      Height      Head Circumference      Peak Flow      Pain Score      Pain Loc      Pain Edu?      Excl. in [place]?     Constitutional: Alert and oriented. Well appearing but somewhat anxious. Eyes: Conjunctivae are normal Head: Atraumatic HEENT: No congestion/rhinnorhea. Mucous membranes are moist.  Oropharynx non-erythematous is normal voice there is no stridor there is no oral pharyngeal swelling or tongue swelling lip swelling etc.  There is some swelling around the eyes,  Reading with no difficulty neck:   Nontender with no meningismus, no masses, no stridor Cardiovascular: Normal rate, regular rhythm. Grossly normal heart sounds.  Good peripheral circulation. Respiratory: Normal respiratory effort.  No retractions. Lungs CTAB. Abdominal: Soft and nontender. No distention. No guarding no rebound Back:  There is no focal tenderness or step off.  there is no midline tenderness there are no lesions noted. there is no CVA tenderness Musculoskeletal: No lower extremity tenderness, no upper extremity tenderness. No joint effusions, no DVT signs strong distal pulses no edema Neurologic:  Normal speech and language. No gross focal neurologic deficits are appreciated.  Skin:  Skin is warm, dry and intact.  Swelling noted to the right hand and little bit on the left forearm.  Some slight redness noted to various parts of the lower extremities Psychiatric: Mood and affect are initially quite anxious.   ____________________________________________   LABS (all labs ordered are listed, but only abnormal results are displayed)  Labs Reviewed - No data to display  Pertinent labs  results that were available during my care of the patient were reviewed by me and considered in my medical decision making (see chart for details). ____________________________________________  EKG  I personally  interpreted any EKGs ordered by me or triage  ____________________________________________  RADIOLOGY  Pertinent labs & imaging results that were available during my care of the patient were reviewed by me and considered in my medical decision making (see chart for details). If possible, patient and/or family made aware of any abnormal findings.  No results found. ____________________________________________    PROCEDURES  Procedure(s) performed: None  Procedures  Critical Care performed: None  ____________________________________________   INITIAL IMPRESSION / ASSESSMENT AND PLAN / ED COURSE  Pertinent labs & imaging results that were available during my care of the patient were reviewed by me and considered in my medical decision making (see chart for details).   Patient seen here after hymenoptera envenomation with no evidence of airway issue but some systemic reaction noted as well as localized reaction, will start with Benadryl steroids Pepcid IV access, and we will reassess.  Family tells me she has no allergies. He mentation that I can access supports this.  I do not think epi is required in this 68 year old woman at this moment but we will watch you to ensure that there  is no need for it.  Keep her on the monitor.     ----------------------------------------- 2:42 PM on 12/25/2018 -----------------------------------------  Patient resting comfortably in the bed states she feels much better swelling is visibly decreased in the right arm and in the face no other evidence of swelling noted no evidence of rest of anaphylaxis no evidence of angioedema or airway issue  ----------------------------------------- 5:04 PM on 12/25/2018 -----------------------------------------   Checking on the patient, no evidence of any significant progression of reaction; looks much better, swelling around the eyes is better.  She states that the bites themselves are still itchy and have  explained to her that that is to be expected.  ----------------------------------------- 6:15 PM on 12/25/2018 -----------------------------------------  Patient with no swelling no complaints, with family eager to go home return precautions follow-up given understood.  Extensive conversation with patient prior to discharge with [place] interpreter patient's questions answered, family and she understands the plan at discharge ____________________________________________   FINAL CLINICAL IMPRESSION(S) / ED DIAGNOSES  Final diagnoses:  None      This chart was dictated using voice recognition software.  Despite best efforts to proofread,  errors can occur which can change meaning.      Jeanmarie PlantMcShane, Damain Broadus A, MD 12/25/18 1405    Jeanmarie PlantMcShane, Rudy Domek A, MD 12/25/18 1410    Jeanmarie PlantMcShane, Ephriam Turman A, MD 12/25/18 1443    Jeanmarie PlantMcShane, Janmichael Giraud A, MD 12/25/18 1705    Jeanmarie PlantMcShane, Stacy Deshler A, MD 12/25/18 1815    Jeanmarie PlantMcShane, Zylen Wenig A, MD 12/25/18 Ebony [id number]

## 2018-12-25 NOTE — ED Notes (Signed)
Patient states she is feeling slightly better after the medications at this time.

## 2018-12-25 NOTE — ED Notes (Addendum)
Pt resting in bed. Family remains at bedside. Rails up. Bed locked low. Call bell within reach.

## 2019-04-30 IMAGING — MR MR 3D RECON AT SCANNER
19 series · 19 of 19 positions shown · IV contrast (13 mL MULTIHANCE)
Comparison: CT AP 09/09/2017.

CLINICAL DATA: Evaluate pancreatitis.  Etiology indeterminate

EXAM:
MRI ABDOMEN WITHOUT AND WITH CONTRAST (INCLUDING MRCP)
TECHNIQUE: Multiplanar multisequence MR imaging of the abdomen was performed
both before and after the administration of intravenous contrast.
Heavily T2-weighted images of the biliary and pancreatic ducts were
obtained, and three-dimensional MRCP images were rendered by post
processing.
CONTRAST:  13mL MULTIHANCE GADOBENATE DIMEGLUMINE 529 MG/ML IV SOLN

[Series 3: T2 · coronal · 8.0mm · 0.78mm/px · 1 of 21 slices shown (1 of 2)]
[im 1/21]
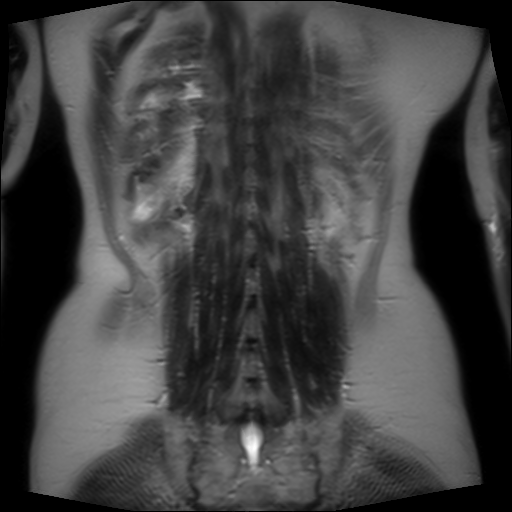

[Series 4: T2 fat-sat · axial · 7.0mm · 0.74mm/px · 1 of 23 slices shown]
[im 1/23]
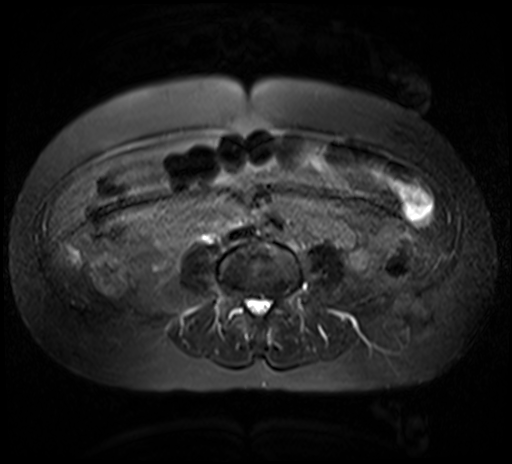

[Series 5: T1 · axial · 7.0mm · 0.74mm/px · 1 of 46 slices shown]
[im 1/46]
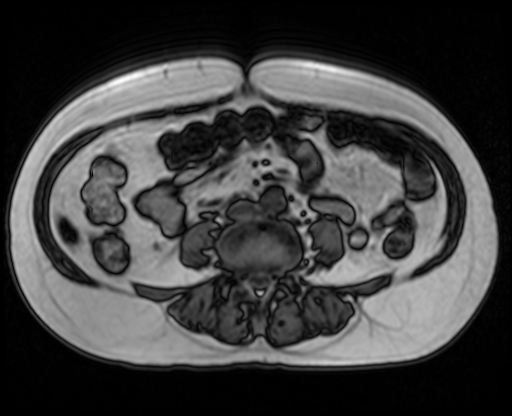

[Series 6: T2 · axial · 6.0mm · 0.74mm/px · 1 of 19 slices shown (2 of 2)]
[im 1/19]
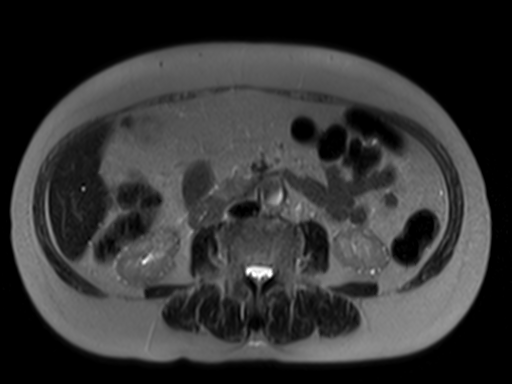

[Series 7: bSSFP · axial · 4.0mm · 0.74mm/px · 1 of 43 slices shown]
[im 1/43]
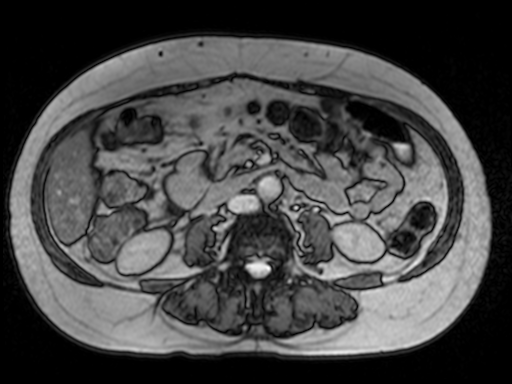

[Series 8: T1 fat-sat · axial · 6.0mm · 1.48mm/px · 1 of 23 slices shown]
[im 1/23]
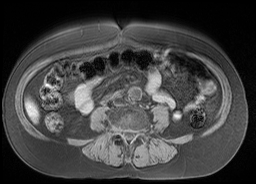

[Series 10: DWI · axial · 6.0mm · 1.98mm/px · 1 of 90 slices shown]
[im 1/90]
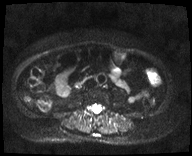

[Series 11: ax dwi_adc · axial · 6.0mm · 1.98mm/px · 1 of 30 slices shown]
[im 1/30]
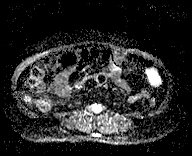

[Series 13: MRCP · coronal · 1.5mm · 0.99mm/px · 1 of 40 slices shown]
[im 1/40]
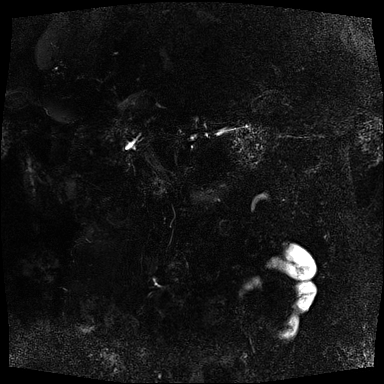

[Series 15: T1 dynamic fat-sat · axial · non-contrast · 2.5mm · 0.74mm/px · 1 of 80 slices shown (1 of 5)]
[im 1/80]
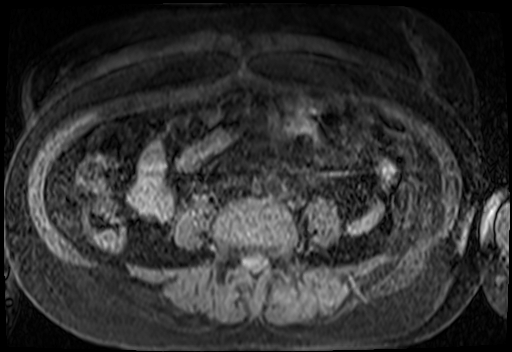

[Series 16: T1 dynamic fat-sat post-contrast · axial · 2.5mm · 0.74mm/px · 1 of 80 slices shown (1 of 5)]
[im 1/80]
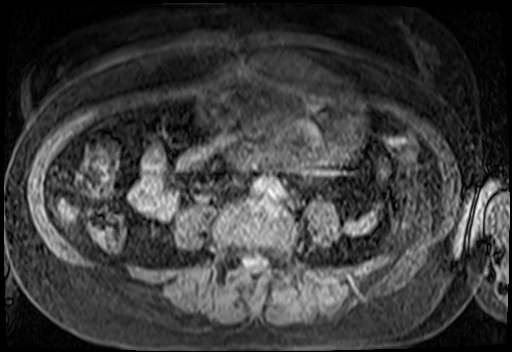

[Series 17: T1 dynamic fat-sat · axial · 2.5mm · 0.74mm/px · 1 of 80 slices shown (2 of 5)]
[im 1/80]
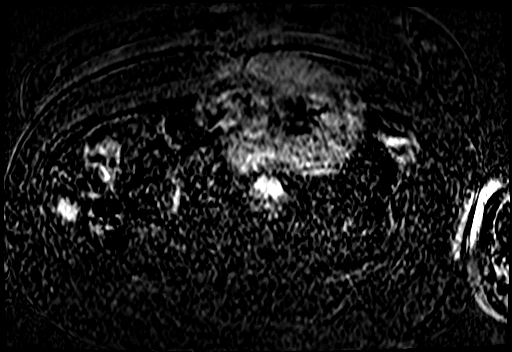

[Series 18: T1 dynamic fat-sat post-contrast · axial · 2.5mm · 0.74mm/px · 1 of 80 slices shown (2 of 5)]
[im 1/80]
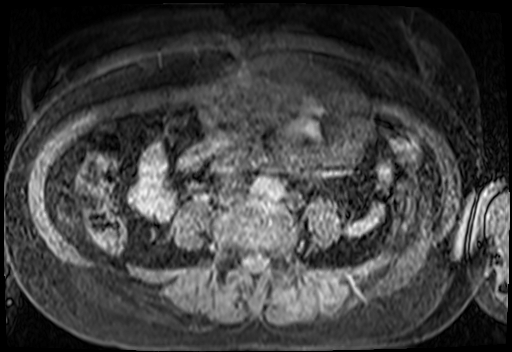

[Series 19: T1 dynamic fat-sat · axial · 2.5mm · 0.74mm/px · 1 of 80 slices shown (3 of 5)]
[im 1/80]
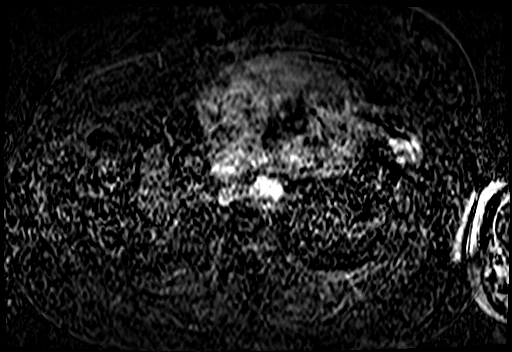

[Series 20: T1 dynamic fat-sat post-contrast · axial · 2.5mm · 0.74mm/px · 1 of 80 slices shown (3 of 5)]
[im 1/80]
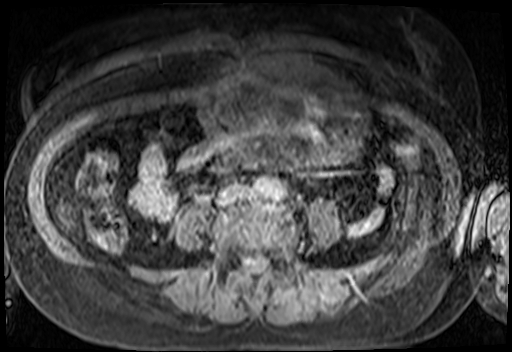

[Series 21: T1 dynamic fat-sat · axial · 2.5mm · 0.74mm/px · 1 of 80 slices shown (4 of 5)]
[im 1/80]
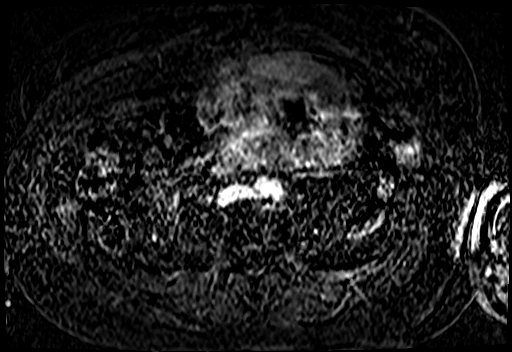

[Series 22: T1 dynamic fat-sat post-contrast · coronal · 2.5mm · 0.82mm/px · 1 of 72 slices shown (4 of 5)]
[im 1/72]
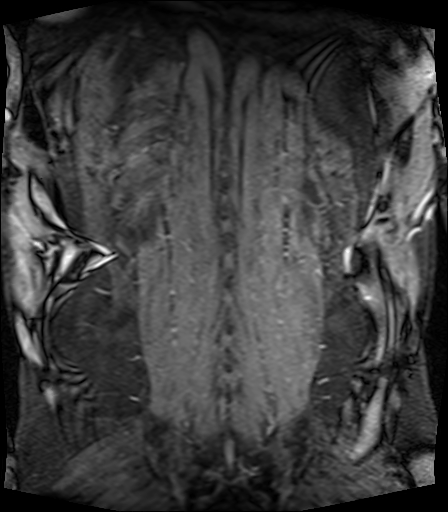

[Series 23: T1 dynamic fat-sat post-contrast · axial · 2.5mm · 0.74mm/px · 1 of 80 slices shown (5 of 5)]
[im 1/80]
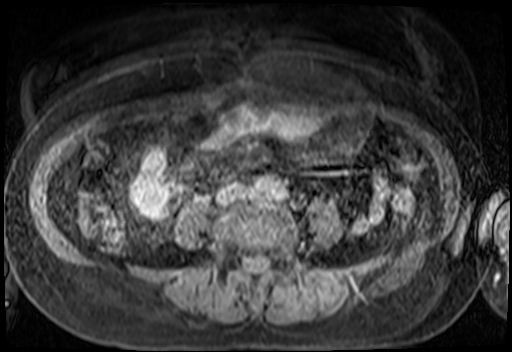

[Series 24: T1 dynamic fat-sat · axial · 2.5mm · 0.74mm/px · 1 of 80 slices shown (5 of 5)]
[im 1/80]
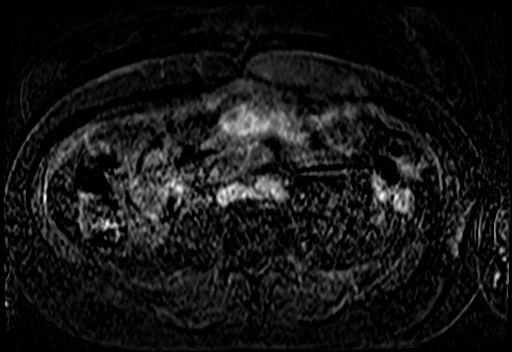

[19 of 19 positions shown; findings below may reference images not displayed]

FINDINGS: Exam detail is diminished due to motion artifact.

Lower chest: No acute findings.

Hepatobiliary: Diffuse hepatic steatosis. Within the limitations of
motion artifact the gallbladder appears normal. No gallstones
identified. The common bile duct is mildly ectatic measuring up to
11 mm. No findings of choledocholithiasis identified. Scattered T2
hyperintensities are identified within both lobes of liver. 1.2 cm
lesion within segment 5 has enhancement characteristics favoring a
benign hemangioma. The lesion within segment 7 measures 1 cm, image
[DATE]. This also has enhancement characteristics favorable for benign
hemangioma. Large T2 hyperintense structure within the lateral
segment of left lobe of liver measures 3.1 cm and likely represents
a simple cyst. Other millimetric T2 hyperintense foci within both
lobes of liver probably represent small cysts.

Pancreas: Evaluation of the pancreas is significantly limited due to
motion artifact. From the neck through the tail of pancreas there is
diffuse chronic appearing parenchymal volume loss. No main duct
dilatation identified. Postcontrast imaging of the pancreas is
significantly diminished due to motion artifact. No discrete mass or
focal fluid collections identified.

Spleen:  Unremarkable appearance of the spleen.

Adrenals/Urinary Tract:  The adrenal glands are normal.

No kidney mass or hydronephrosis identified.

Stomach/Bowel: Visualized portions within the abdomen are
unremarkable.

Vascular/Lymphatic: Aortic atherosclerosis. No aneurysm. No upper
abdominal adenopathy.

Other:  No ascites or focal fluid collections.

Musculoskeletal: No suspicious bone lesions identified.
IMPRESSION: 1. Significantly diminished exam detail secondary to motion
artifact.
2. Within the limitations of motion artifact there are no
complications from pancreatitis. No pancreatic necrosis or
pseudocyst identified.
3. Multiple T2 hyperintense lesions within the liver are difficult
to characterize due to motion artifact but are favored to represent
a combination of multiple cysts and benign hemangiomas.
4. Hepatic steatosis
5.  Aortic Atherosclerosis (41OM6-1YI.I).

## 2019-06-21 ENCOUNTER — Other Ambulatory Visit: Payer: Self-pay | Admitting: Family Medicine

## 2019-06-21 DIAGNOSIS — Z1231 Encounter for screening mammogram for malignant neoplasm of breast: Secondary | ICD-10-CM

## 2019-07-09 ENCOUNTER — Ambulatory Visit
Admission: RE | Admit: 2019-07-09 | Discharge: 2019-07-09 | Disposition: A | Discharge status: Home / Self Care | Payer: Medicare Other | Source: Ambulatory Visit | Attending: Family Medicine | Admitting: Family Medicine

## 2019-07-09 DIAGNOSIS — Z1231 Encounter for screening mammogram for malignant neoplasm of breast: Secondary | ICD-10-CM | POA: Insufficient documentation

## 2020-06-30 ENCOUNTER — Other Ambulatory Visit: Payer: Self-pay | Admitting: Family Medicine

## 2020-06-30 DIAGNOSIS — Z1231 Encounter for screening mammogram for malignant neoplasm of breast: Secondary | ICD-10-CM

## 2020-07-09 ENCOUNTER — Emergency Department
Admission: EM | Admit: 2020-07-09 | Discharge: 2020-07-09 | Disposition: A | Discharge status: Home / Self Care | Payer: Medicare Other | Attending: Emergency Medicine | Admitting: Emergency Medicine

## 2020-07-09 ENCOUNTER — Other Ambulatory Visit: Payer: Self-pay

## 2020-07-09 ENCOUNTER — Emergency Department: Payer: Medicare Other

## 2020-07-09 ENCOUNTER — Encounter: Payer: Self-pay | Admitting: Emergency Medicine

## 2020-07-09 DIAGNOSIS — Z79899 Other long term (current) drug therapy: Secondary | ICD-10-CM | POA: Diagnosis not present

## 2020-07-09 DIAGNOSIS — I10 Essential (primary) hypertension: Secondary | ICD-10-CM | POA: Diagnosis not present

## 2020-07-09 DIAGNOSIS — Z20822 Contact with and (suspected) exposure to covid-19: Secondary | ICD-10-CM | POA: Diagnosis not present

## 2020-07-09 DIAGNOSIS — Z7984 Long term (current) use of oral hypoglycemic drugs: Secondary | ICD-10-CM | POA: Diagnosis not present

## 2020-07-09 DIAGNOSIS — E119 Type 2 diabetes mellitus without complications: Secondary | ICD-10-CM | POA: Insufficient documentation

## 2020-07-09 DIAGNOSIS — R059 Cough, unspecified: Secondary | ICD-10-CM | POA: Diagnosis present

## 2020-07-09 DIAGNOSIS — J101 Influenza due to other identified influenza virus with other respiratory manifestations: Secondary | ICD-10-CM | POA: Diagnosis not present

## 2020-07-09 DIAGNOSIS — E039 Hypothyroidism, unspecified: Secondary | ICD-10-CM | POA: Insufficient documentation

## 2020-07-09 LAB — RESP PANEL BY RT-PCR (FLU A&B, COVID) ARPGX2
Influenza A by PCR: POSITIVE — AB
Influenza B by PCR: NEGATIVE
SARS Coronavirus 2 by RT PCR: NEGATIVE

## 2020-07-09 MED ORDER — GUAIFENESIN-CODEINE 100-10 MG/5ML PO SOLN: 5.0000 mL | Freq: Four times a day (QID) | ORAL | 0 refills | Status: AC | PRN

## 2020-07-09 MED ORDER — OSELTAMIVIR PHOSPHATE 75 MG PO CAPS: 75.0000 mg | ORAL_CAPSULE | Freq: Two times a day (BID) | ORAL | 0 refills | Status: AC

## 2020-07-09 NOTE — ED Triage Notes (Signed)
Pt dtr reports that pt has had chills, headache, body aches x 2-3 days. Pt has been exposed to COVID.

## 2020-07-09 NOTE — ED Provider Notes (Signed)
[facility] Emergency Department Provider Note  ____________________________________________   Event Date/Time   First MD Initiated Contact with Patient 07/09/20 1147     (approximate)  I have reviewed the triage vital signs and the nursing notes.   HISTORY  Chief Complaint Fever and Generalized Body Aches  Daughter and language line used for [place].  HPI Sydney Howe is a 70 y.o. female is brought to the ED by daughter with patient having history of chills, body aches, productive green cough and loss of appetite for the last 3 days.  Patient states that she feels worse at night.  She has been taking Tylenol cold and flu with little relief.  Patient believes that she was exposed to a family member who was positive for Covid.  Patient denies nausea or vomiting but daughter states that she has had some minimal loose stools.  Patient did get Pfizer vaccine x3.       Past Medical History:  Diagnosis Date   Acquired hypothyroidism    Chronic hepatitis B (HCC)    Diabetes mellitus without complication (HCC)    Diet-controlled diabetes mellitus (HCC)    GERD (gastroesophageal reflux disease)    Graves disease    Hypertension    Hyperuricemia    Rheumatoid factor positive     Patient Active Problem List   Diagnosis Date Noted   Acute pancreatitis 09/09/2017    Past Surgical History:  Procedure Laterality Date   ESOPHAGOGASTRODUODENOSCOPY (EGD) WITH PROPOFOL N/A 12/13/2014   Procedure: ESOPHAGOGASTRODUODENOSCOPY (EGD) WITH PROPOFOL;  Surgeon: Wallace Cullens, MD;  Location: [facility] ENDOSCOPY;  Service: Gastroenterology;  Laterality: N/A;    Prior to Admission medications   Medication Sig Start Date End Date Taking? Authorizing Provider  guaiFENesin-codeine 100-10 MG/5ML syrup Take 5 mLs by mouth every 6 (six) hours as needed for cough. 07/09/20  Yes Tommi Rumps, PA-C  oseltamivir (TAMIFLU) 75 MG capsule Take 1 capsule (75 mg total) by mouth  2 (two) times daily for 5 days. 07/09/20 07/14/20 Yes Tommi Rumps, PA-C  acetaminophen (TYLENOL) 325 MG tablet Take 2 tablets (650 mg total) by mouth every 6 (six) hours as needed for mild pain or moderate pain (or Fever >/= 101). 09/12/17   Alford Highland, MD  allopurinol (ZYLOPRIM) 300 MG tablet Take 1 tablet by mouth daily. 03/04/17   [provider]  diphenhydrAMINE (BENADRYL) 25 MG tablet Take 2 tablets (50 mg total) by mouth every 6 (six) hours as needed for itching. 12/25/18   Jeanmarie Plant, MD  famotidine (PEPCID) 20 MG tablet Take 1 tablet (20 mg total) by mouth 2 (two) times daily. 12/25/18 12/25/19  Jeanmarie Plant, MD  levothyroxine (SYNTHROID, LEVOTHROID) 50 MCG tablet Take 50 mcg by mouth daily before breakfast.     [provider]  metFORMIN (GLUCOPHAGE) 500 MG tablet Take 1 tablet by mouth 2 (two) times daily.    [provider]  omeprazole (PRILOSEC OTC) 20 MG tablet Take 1 tablet by mouth daily. 09/04/17   [provider]  oxyCODONE (OXY IR/ROXICODONE) 5 MG immediate release tablet Take 1 tablet (5 mg total) by mouth every 6 (six) hours as needed for severe pain. 09/12/17   Alford Highland, MD  predniSONE (DELTASONE) 20 MG tablet Take 3 tablets (60 mg total) by mouth daily. 12/25/18   Jeanmarie Plant, MD    Allergies Patient has no known allergies.  Family History  Problem Relation Age of Onset   Kidney disease Neg  Hx     Social History Social History   Tobacco Use   Smoking status: Never Smoker   Smokeless tobacco: Never Used  Substance Use Topics   Alcohol use: Never    Review of Systems Constitutional: Positive fever/chills Eyes: No visual changes. ENT: No sore throat.  Positive congestion. Cardiovascular: Denies chest pain. Respiratory: Denies shortness of breath.  Positive cough. Gastrointestinal: No abdominal pain.  No nausea, no vomiting.  Positive diarrhea.  Genitourinary: Negative for dysuria. Musculoskeletal:  Positive muscle aches. Skin: Negative for rash. Neurological: Negative for headaches, focal weakness or numbness. ____________________________________________   PHYSICAL EXAM:  VITAL SIGNS: ED Triage Vitals  Enc Vitals Group     BP 07/09/20 1059 124/70     Pulse Rate 07/09/20 1059 76     Resp 07/09/20 1059 16     Temp 07/09/20 1059 99.8 F (37.7 C)     Temp Source 07/09/20 1059 Oral     SpO2 07/09/20 1059 96 %     Weight 07/09/20 1137 150 lb (68 kg)     Height 07/09/20 1137 5\' 3"  (1.6 m)     Head Circumference --      Peak Flow --      Pain Score 07/09/20 1136 6     Pain Loc --      Pain Edu? --      Excl. in GC? --     Constitutional: Alert and oriented. Well appearing and in no acute distress. Eyes: Conjunctivae are normal.  Head: Atraumatic. Nose: No congestion/rhinnorhea. Mouth/Throat: Mucous membranes are moist.  Oropharynx non-erythematous. Neck: No stridor.   Cardiovascular: Normal rate, regular rhythm. Grossly normal heart sounds.  Good peripheral circulation. Respiratory: Normal respiratory effort.  No retractions. Lungs no wheezing, rales or rhonchi noted. Gastrointestinal: Soft and nontender. No distention.  Bowel sounds are active x4 quadrants.  No CVA tenderness. Musculoskeletal: Moves upper and lower extremities without any difficulty.  Patient is able to ambulate with little assistance. Neurologic:  Normal speech and language. No gross focal neurologic deficits are appreciated. No gait instability. Skin:  Skin is warm, dry and intact. No rash noted. Psychiatric: Mood and affect are normal. Speech and behavior are normal.  ____________________________________________   LABS (all labs ordered are listed, but only abnormal results are displayed)  Labs Reviewed  RESP PANEL BY RT-PCR (FLU A&B, COVID) ARPGX2 - Abnormal; Notable for the following components:      Result Value   Influenza A by PCR POSITIVE (*)    All other components within normal limits     RADIOLOGY I, 07/11/20, personally viewed and evaluated these images (plain radiographs) as part of my medical decision making, as well as reviewing the written report by the radiologist.   Official radiology report(s): DG Chest Port 1 View  Result Date: 07/09/2020 CLINICAL DATA:  Cough, fever EXAM: PORTABLE CHEST 1 VIEW COMPARISON:  None. FINDINGS: Cardiomegaly. Atherosclerotic calcification of the aortic knob. Diffuse interstitial prominence with patchy bibasilar opacities, right greater than left. No pleural effusion or pneumothorax. No acute osseous findings. IMPRESSION: Diffuse interstitial prominence with patchy bibasilar opacities, right greater than left. Findings may represent atypical/viral pneumonia or edema. Electronically Signed   By: 07/11/2020 D.O.   On: 07/09/2020 12:42    ____________________________________________   PROCEDURES  Procedure(s) performed (including Critical Care):  Procedures   ____________________________________________   INITIAL IMPRESSION / ASSESSMENT AND PLAN / ED COURSE  As part of my medical decision making, I reviewed the following  data within the electronic MEDICAL RECORD NUMBER   70 year old female presents to the ED with onset of chills, muscle aches, headache and fever that started 2 to 3 days ago.  Patient states that she was exposed to a family member who was positive for Covid.  Daughter is with patient.  Prior to discharge the [place] interpreter was obtained by language line and patient was made aware and also asked if she had any questions.  A prescription for Tamiflu was sent to her pharmacy along with prescription for Robitussin-AC as needed for cough.  She is aware that this could cause drowsiness and increase her risk for falling.  She may continue taking Tylenol or ibuprofen as needed for fever, headache or body aches.  She is to follow-up with her PCP if any continued problems.  She is to return to the emergency  department if any severe worsening of her breathing, shortness of breath or other urgent concerns. ____________________________________________   FINAL CLINICAL IMPRESSION(S) / ED DIAGNOSES  Final diagnoses:  Influenza A     ED Discharge Orders         Ordered    oseltamivir (TAMIFLU) 75 MG capsule  2 times daily        07/09/20 1335    guaiFENesin-codeine 100-10 MG/5ML syrup  Every 6 hours PRN        07/09/20 1335          *Please note:  Sydney Howe was evaluated in Emergency Department on 07/09/2020 for the symptoms described in the history of present illness. She was evaluated in the context of the global COVID-19 pandemic, which necessitated consideration that the patient might be at risk for infection with the SARS-CoV-2 virus that causes COVID-19. Institutional protocols and algorithms that pertain to the evaluation of patients at risk for COVID-19 are in a state of rapid change based on information released by regulatory bodies including the CDC and federal and state organizations. These policies and algorithms were followed during the patient's care in the ED.  Some ED evaluations and interventions may be delayed as a result of limited staffing during and the pandemic.*   Note:  This document was prepared using Dragon voice recognition software and may include unintentional dictation errors.    Tommi Rumps, PA-C 07/09/20 1347    Delton Prairie, MD 07/09/20 [phone]

## 2020-07-09 NOTE — ED Notes (Signed)
Pt. C/o chills, body aches, productive cough with green mucous, loss of appetite for 3 days, unable to sleep last night and feels worse today

## 2020-07-09 NOTE — ED Triage Notes (Signed)
Pt to ED via POV for Fever, Headache, and body aches. RN attempted to use VRI for triage, was informed that there is not a laotian interpreter available at this time. Pt dtr was called to translate for pt, pt dtr stated that she was getting something to eat and would be back soon.

## 2020-07-09 NOTE — Discharge Instructions (Addendum)
Follow-up with your primary care provider if any continued problems.  A prescription for Tamiflu was sent to your pharmacy along with a prescription for cough medication if needed.  The cough medication contains codeine which may make you drowsy.  Increase fluids.  You may take Tylenol or ibuprofen with these medications if needed for muscle aches, fever, headache.  This is also contagious and other family members may get it.  Return to the emergency department if any severe worsening of your symptoms or shortness of breath.

## 2020-07-24 ENCOUNTER — Ambulatory Visit
Admission: RE | Admit: 2020-07-24 | Discharge: 2020-07-24 | Disposition: A | Discharge status: Home / Self Care | Payer: Medicare Other | Source: Ambulatory Visit | Attending: Family Medicine | Admitting: Family Medicine

## 2020-07-24 ENCOUNTER — Other Ambulatory Visit: Payer: Self-pay

## 2020-07-24 DIAGNOSIS — Z1231 Encounter for screening mammogram for malignant neoplasm of breast: Secondary | ICD-10-CM | POA: Diagnosis present

## 2023-12-11 ENCOUNTER — Other Ambulatory Visit: Payer: Self-pay | Admitting: Family Medicine

## 2023-12-11 DIAGNOSIS — K861 Other chronic pancreatitis: Secondary | ICD-10-CM

## 2023-12-11 DIAGNOSIS — R1013 Epigastric pain: Secondary | ICD-10-CM

## 2023-12-22 ENCOUNTER — Other Ambulatory Visit: Payer: Self-pay | Admitting: Family Medicine

## 2023-12-22 DIAGNOSIS — R1011 Right upper quadrant pain: Secondary | ICD-10-CM

## 2023-12-23 ENCOUNTER — Ambulatory Visit

## 2023-12-24 ENCOUNTER — Other Ambulatory Visit: Payer: Self-pay | Admitting: Family Medicine

## 2023-12-24 ENCOUNTER — Ambulatory Visit
Admission: RE | Admit: 2023-12-24 | Discharge: 2023-12-24 | Disposition: A | Discharge status: Home / Self Care | Source: Ambulatory Visit | Attending: Family Medicine | Admitting: Family Medicine

## 2023-12-24 DIAGNOSIS — R1013 Epigastric pain: Secondary | ICD-10-CM | POA: Insufficient documentation

## 2023-12-24 DIAGNOSIS — R1011 Right upper quadrant pain: Secondary | ICD-10-CM

## 2023-12-24 DIAGNOSIS — K861 Other chronic pancreatitis: Secondary | ICD-10-CM

## 2023-12-31 ENCOUNTER — Other Ambulatory Visit: Payer: Self-pay | Admitting: Family Medicine

## 2023-12-31 DIAGNOSIS — R932 Abnormal findings on diagnostic imaging of liver and biliary tract: Secondary | ICD-10-CM

## 2024-01-13 ENCOUNTER — Ambulatory Visit
Admission: RE | Admit: 2024-01-13 | Discharge: 2024-01-13 | Disposition: A | Discharge status: Home / Self Care | Source: Ambulatory Visit | Attending: Family Medicine | Admitting: Family Medicine

## 2024-01-13 DIAGNOSIS — R932 Abnormal findings on diagnostic imaging of liver and biliary tract: Secondary | ICD-10-CM | POA: Diagnosis present

## 2024-01-13 MED ORDER — GADOBUTROL 1 MMOL/ML IV SOLN
6.0000 mL | Freq: Once | INTRAVENOUS | Status: AC | PRN
  Administered 2024-01-13: 6 mL via INTRAVENOUS

## 2024-01-28 DIAGNOSIS — K8020 Calculus of gallbladder without cholecystitis without obstruction: Secondary | ICD-10-CM

## 2024-01-28 HISTORY — DX: Calculus of gallbladder without cholecystitis without obstruction: K80.20

## 2024-01-30 ENCOUNTER — Other Ambulatory Visit: Payer: Self-pay | Admitting: Internal Medicine

## 2024-01-30 DIAGNOSIS — G8929 Other chronic pain: Secondary | ICD-10-CM

## 2024-01-30 DIAGNOSIS — K8020 Calculus of gallbladder without cholecystitis without obstruction: Secondary | ICD-10-CM

## 2024-02-02 ENCOUNTER — Other Ambulatory Visit: Payer: Self-pay

## 2024-02-02 DIAGNOSIS — I10 Essential (primary) hypertension: Secondary | ICD-10-CM | POA: Insufficient documentation

## 2024-02-02 DIAGNOSIS — E119 Type 2 diabetes mellitus without complications: Secondary | ICD-10-CM | POA: Insufficient documentation

## 2024-02-02 DIAGNOSIS — K8020 Calculus of gallbladder without cholecystitis without obstruction: Secondary | ICD-10-CM | POA: Insufficient documentation

## 2024-02-02 DIAGNOSIS — R1013 Epigastric pain: Secondary | ICD-10-CM | POA: Diagnosis present

## 2024-02-02 LAB — COMPREHENSIVE METABOLIC PANEL WITH GFR
ALT: 15 U/L (ref 0–44)
AST: 22 U/L (ref 15–41)
Albumin: 4.2 g/dL (ref 3.5–5.0)
Alkaline Phosphatase: 29 U/L — ABNORMAL LOW (ref 38–126)
Anion gap: 9 (ref 5–15)
BUN: 26 mg/dL — ABNORMAL HIGH (ref 8–23)
CO2: 25 mmol/L (ref 22–32)
Calcium: 10.3 mg/dL (ref 8.9–10.3)
Chloride: 103 mmol/L (ref 98–111)
Creatinine, Ser: 1.48 mg/dL — ABNORMAL HIGH (ref 0.44–1.00)
GFR, Estimated: 37 mL/min — ABNORMAL LOW (ref >60)
Glucose, Bld: 177 mg/dL — ABNORMAL HIGH (ref 70–99)
Potassium: 4.1 mmol/L (ref 3.5–5.1)
Sodium: 137 mmol/L (ref 135–145)
Total Bilirubin: 0.5 mg/dL (ref 0.0–1.2)
Total Protein: 8.2 g/dL — ABNORMAL HIGH (ref 6.5–8.1)

## 2024-02-02 LAB — CBC
HCT: 34.1 % — ABNORMAL LOW (ref 36.0–46.0)
Hemoglobin: 10.7 g/dL — ABNORMAL LOW (ref 12.0–15.0)
MCH: 21.5 pg — ABNORMAL LOW (ref 26.0–34.0)
MCHC: 31.4 g/dL (ref 30.0–36.0)
MCV: 68.5 fL — ABNORMAL LOW (ref 80.0–100.0)
Platelets: 226 K/uL (ref 150–400)
RBC: 4.98 MIL/uL (ref 3.87–5.11)
RDW: 17.4 % — ABNORMAL HIGH (ref 11.5–15.5)
WBC: 5.3 K/uL (ref 4.0–10.5)
nRBC: 0 % (ref 0.0–0.2)

## 2024-02-02 LAB — URINALYSIS, ROUTINE W REFLEX MICROSCOPIC
Bacteria, UA: NONE SEEN
Bilirubin Urine: NEGATIVE
Glucose, UA: >=500 mg/dL — AB
Hgb urine dipstick: NEGATIVE
Ketones, ur: NEGATIVE mg/dL
Nitrite: NEGATIVE
Protein, ur: NEGATIVE mg/dL
Specific Gravity, Urine: 1.007 (ref 1.005–1.030)
pH: 6 (ref 5.0–8.0)

## 2024-02-02 NOTE — ED Triage Notes (Signed)
 Patient brought in by son for mid upper abd pain since waking. Pain worsened throughout day. Suspected gallstone with nuc med hepato scheduled 10/27. Denies N/V/D. Ambulatory to triage.

## 2024-02-03 ENCOUNTER — Emergency Department

## 2024-02-03 ENCOUNTER — Emergency Department
Admission: EM | Admit: 2024-02-03 | Discharge: 2024-02-03 | Disposition: A | Discharge status: Home / Self Care | Attending: Emergency Medicine | Admitting: Emergency Medicine

## 2024-02-03 DIAGNOSIS — R1013 Epigastric pain: Secondary | ICD-10-CM

## 2024-02-03 DIAGNOSIS — K8020 Calculus of gallbladder without cholecystitis without obstruction: Secondary | ICD-10-CM | POA: Diagnosis not present

## 2024-02-03 LAB — LIPASE, BLOOD: Lipase: 21 U/L (ref 11–51)

## 2024-02-03 LAB — TROPONIN I (HIGH SENSITIVITY): Troponin I (High Sensitivity): 7 ng/L (ref <18)

## 2024-02-03 MED ORDER — KETOROLAC TROMETHAMINE 15 MG/ML IJ SOLN
15.0000 mg | Freq: Once | INTRAMUSCULAR | Status: AC
Start: 2024-02-03 — End: 2024-02-03
  Administered 2024-02-03: 15 mg via INTRAVENOUS
  Filled 2024-02-03: qty 1

## 2024-02-03 MED ORDER — PANTOPRAZOLE SODIUM 40 MG IV SOLR
40.0000 mg | Freq: Once | INTRAVENOUS | Status: AC
  Administered 2024-02-03: 40 mg via INTRAVENOUS
  Filled 2024-02-03: qty 10

## 2024-02-03 MED ORDER — MORPHINE SULFATE (PF) 4 MG/ML IV SOLN
4.0000 mg | Freq: Once | INTRAVENOUS | Status: AC
  Administered 2024-02-03: 4 mg via INTRAVENOUS
  Filled 2024-02-03: qty 1

## 2024-02-03 MED ORDER — SODIUM CHLORIDE 0.9 % IV BOLUS
1000.0000 mL | Freq: Once | INTRAVENOUS | Status: AC
  Administered 2024-02-03: 1000 mL via INTRAVENOUS

## 2024-02-03 MED ORDER — LIDOCAINE VISCOUS HCL 2 % MT SOLN
15.0000 mL | Freq: Once | OROMUCOSAL | Status: AC
  Administered 2024-02-03: 15 mL via ORAL
  Filled 2024-02-03: qty 15

## 2024-02-03 MED ORDER — IOHEXOL 300 MG/ML  SOLN
80.0000 mL | Freq: Once | INTRAMUSCULAR | Status: AC | PRN
Start: 2024-02-03 — End: 2024-02-03
  Administered 2024-02-03: 80 mL via INTRAVENOUS

## 2024-02-03 MED ORDER — ONDANSETRON HCL 4 MG/2ML IJ SOLN
4.0000 mg | Freq: Once | INTRAMUSCULAR | Status: AC
  Administered 2024-02-03: 4 mg via INTRAVENOUS
  Filled 2024-02-03: qty 2

## 2024-02-03 MED ORDER — ALUM & MAG HYDROXIDE-SIMETH 200-200-20 MG/5ML PO SUSP
30.0000 mL | Freq: Once | ORAL | Status: AC
  Administered 2024-02-03: 30 mL via ORAL
  Filled 2024-02-03: qty 30

## 2024-02-03 NOTE — ED Provider Notes (Signed)
 [facility] Provider Note    Event Date/Time   First MD Initiated Contact with Patient 02/03/24 0109     (approximate)   History   Abdominal Pain   HPI  Sydney Howe is a 73 y.o. female   Past medical history of hepatitis B, diabetes, hypertension, hypothyroid, presents emerged apartment epigastric pain.  3 days ago it started and has been constant and unchanged.  No nausea or vomiting.  Bowel movements have been normal no GI bleeding or diarrhea.  No urinary symptoms.  No fever.  No chest pain or shortness of breath.  Information acquired via [place] interpreter  Independent Historian contributed to assessment above: Family member at bedside corroborates information above  External Medical Documents Reviewed: Gastroenterology note from earlier this month noting of chronic right upper quadrant pain and history of autoimmune pancreatitis      Physical Exam   Triage Vital Signs: ED Triage Vitals  Encounter Vitals Group     BP 02/02/24 2136 (!) 164/78     Girls Systolic BP Percentile --      Girls Diastolic BP Percentile --      Boys Systolic BP Percentile --      Boys Diastolic BP Percentile --      Pulse Rate 02/02/24 2136 (!) 50     Resp 02/02/24 2136 18     Temp 02/02/24 2136 98 F (36.7 C)     Temp Source 02/02/24 2136 Oral     SpO2 02/02/24 2136 95 %     Weight 02/02/24 2137 154 lb (69.9 kg)     Height --      Head Circumference --      Peak Flow --      Pain Score 02/02/24 2137 6     Pain Loc --      Pain Education --      Exclude from Growth Chart --     Most recent vital signs: Vitals:   02/02/24 2136  BP: (!) 164/78  Pulse: (!) 50  Resp: 18  Temp: 98 F (36.7 C)  SpO2: 95%    General: Awake, no distress.  CV:  Good peripheral perfusion.  Resp:  Normal effort.  Abd:  No distention.  Other:  Awake alert comfortable appearing in no acute distress, normal vital signs, but mild epigastric/right upper quadrant  tenderness to palpation without rigidity or guarding.   ED Results / Procedures / Treatments   Labs (all labs ordered are listed, but only abnormal results are displayed) Labs Reviewed  COMPREHENSIVE METABOLIC PANEL WITH GFR - Abnormal; Notable for the following components:      Result Value   Glucose, Bld 177 (*)    BUN 26 (*)    Creatinine, Ser 1.48 (*)    Total Protein 8.2 (*)    Alkaline Phosphatase 29 (*)    GFR, Estimated 37 (*)    All other components within normal limits  CBC - Abnormal; Notable for the following components:   Hemoglobin 10.7 (*)    HCT 34.1 (*)    MCV 68.5 (*)    MCH 21.5 (*)    RDW 17.4 (*)    All other components within normal limits  URINALYSIS, ROUTINE W REFLEX MICROSCOPIC - Abnormal; Notable for the following components:   Color, Urine STRAW (*)    APPearance CLEAR (*)    Glucose, UA >=500 (*)    Leukocytes,Ua SMALL (*)    All other components within normal limits  LIPASE, BLOOD  TROPONIN I (HIGH SENSITIVITY)  TROPONIN I (HIGH SENSITIVITY)     I ordered and reviewed the above labs they are notable for LFTs, lipase unremarkable.  She does have an AKI with a creatinine 1.48 compared to prior less than 1, chronic unchanged anemia EKG  ED ECG REPORT I, Ginnie Shams, the attending physician, personally viewed and interpreted this ECG.   Date: 02/03/2024  EKG Time: 2144  Rate: 49  Rhythm: sinus bradycardia  Axis: nl  Intervals:rbbb  ST&T Change: no stemi    RADIOLOGY I independently reviewed and interpreted CT of the abdomen pelvis to see a gallstone but no significant inflammatory obstructive changes I also reviewed radiologist's formal read.   PROCEDURES:  Critical Care performed: No  Procedures   MEDICATIONS ORDERED IN ED: Medications  sodium chloride  0.9 % bolus 1,000 mL (1,000 mLs Intravenous New Bag/Given 02/03/24 0147)  ondansetron  (ZOFRAN ) injection 4 mg (4 mg Intravenous Given 02/03/24 0134)  morphine  (PF) 4 MG/ML  injection 4 mg (4 mg Intravenous Given 02/03/24 0135)  ketorolac (TORADOL) 15 MG/ML injection 15 mg (15 mg Intravenous Given 02/03/24 0133)  pantoprazole  (PROTONIX ) injection 40 mg (40 mg Intravenous Given 02/03/24 0135)  alum & mag hydroxide-simeth (MAALOX/MYLANTA) 200-200-20 MG/5ML suspension 30 mL (30 mLs Oral Given 02/03/24 0148)    And  lidocaine  (XYLOCAINE ) 2 % viscous mouth solution 15 mL (15 mLs Oral Given 02/03/24 0148)  iohexol (OMNIPAQUE) 300 MG/ML solution 80 mL (80 mLs Intravenous Contrast Given 02/03/24 0223)    IMPRESSION / MDM / ASSESSMENT AND PLAN / ED COURSE  I reviewed the triage vital signs and the nursing notes.                                Patient's presentation is most consistent with acute presentation with potential threat to life or bodily function.  Differential diagnosis includes, but is not limited to, cholelithiasis, cholecystitis, choledocholithiasis, pancreatitis, ACS, gastritis/GERD/ulcer   The patient is on the cardiac monitor to evaluate for evidence of arrhythmia and/or significant heart rate changes.  MDM:    Patient notes acute epigastric pain for 3 days.  Gastroenterology note from earlier this month notes chronic right upper quadrant/epigastric pain.  Will better assess today with a CT scan to rule out surgical abdominal emergencies.  Fortunately LFTs, labs lipase all unremarkable and this currently stable patient.  Will give GI cocktail.  Give fluids for AKI.  Pending results of CT scan, disposition to be made.  Will require outpatient follow-up with GI.  Can put her in touch with her outpatient surgery consultation as well for her cholelithiasis in case this is a cause of her chronic pain.  Unremarkable imaging.  I considered hospitalization for admission or observation however given her chronicity of symptoms, no emergency findings on CT scan or ultrasound or on lab testing, I think outpatient follow-up with GI and outpatient surgery appropriate  at this time.        FINAL CLINICAL IMPRESSION(S) / ED DIAGNOSES   Final diagnoses:  Epigastric pain  Calculus of gallbladder without cholecystitis without obstruction     Rx / DC Orders   ED Discharge Orders     None        Note:  This document was prepared using Dragon voice recognition software and may include unintentional dictation errors.    Shams Ginnie, MD 02/03/24 [phone]

## 2024-02-03 NOTE — Discharge Instructions (Signed)
 Fortunately evaluation Emergency Department did not show any emergency conditions that require you to be hospitalized at this time or have an emergency surgery.  Follow-up with Dr. Aundria, gastroenterology, for further workup and evaluation and treatment planning for your abdominal pain.  You have gallstones.  These may contribute to pain in your upper abdomen.  Dr. Cesar is a surgeon who specializes in gallbladder, for whom you can call for an appointment.  Take acetaminophen  650 mg and ibuprofen 400 mg every 6 hours for pain.  Take with food. Continue taking your omeprazole  and famotidine  which can help with stomach acid related pain.  Thank you for choosing us  for your health care today!  Please see your primary doctor this week for a follow up appointment.   If you have any new, worsening, or unexpected symptoms call your doctor right away or come back to the emergency department for reevaluation.  It was my pleasure to care for you today.   Ginnie EDISON Cyrena, MD  -- ???????????????????????????????????????????????????????????????????????????????????????? ?? ??????????????????????????????.  ??????????????? [place], gastroenterology, ????????????????????????????????????????????????????????????????????????????????????????.  ???????????????????.  ????????????????????????????????????????????????.  ?????? [place] ??????????????????????????????????????, ???????????????????????????????.  ??? acetaminophen  650 mg ??? ibuprofen 400 mg ???? 6 ??????????????????????.  ????????????. ??????????? omeprazole  ??? famotidine  ?????????????????????????????????????????????.  ?????????????????????????????????????????????????????????????!  ????????????????????????????????????????????????????????.   ??????????????????, ???????????, ????????????, ??????????????????????????????????????????????????????????????.  ?????????????????????????????????????????.   Kamika Goodloe S. Cyrena, MD

## 2024-02-04 NOTE — H&P (View-Only) (Signed)
 History of Present Illness Sydney Howe is a 73 year old female who presents with recurrent right upper quadrant pain.  Level of Interpreter Services: Facility-provided interpreter (via video)   She has been experiencing right upper quadrant pain for many years, with an increase in intensity over the past four to five days. The pain is intermittent, lasting four to five minutes before subsiding and returning after eight to ten minutes. It occurs spontaneously, including at night, without specific triggers such as eating or physical activity.  The pain radiates to her back and shoulders. She visited the emergency room two days ago, where an abdominal ultrasound showed colitis without signs of cholecystitis. A CT scan of the abdomen and pelvis was performed. An MRI of the abdomen without contrast performed on January 13, 2024, showed cholelithiasis without signs of cholecystitis. I personally evaluated these images.       PAST MEDICAL HISTORY:  Past Medical History:  Diagnosis Date  . Chronic hepatitis B (CMS/HHS-HCC)   . Diabetes mellitus type 2, uncomplicated (CMS/HHS-HCC)   . Essential hypertension   . History of Graves' disease   . Hyperuricemia   . Hypothyroidism, acquired   . Reflux esophagitis 12/13/2014  . Rheumatoid factor positive    a. Negative anti-CCP antibody  b.  No evidence of rheumatoid synovitis        PAST SURGICAL HISTORY:   Past Surgical History:  Procedure Laterality Date  . COLONOSCOPY  07/24/2010   Diverticulosis  . EGD  07/24/2010   Barrett's esophagus  . Dexa  07/2013  . EGD  12/13/2014   Reflux esophagitis/No Repeat/PYO  . UPPER GASTROINTESTINAL ENDOSCOPY N/A 11/04/2017   Procedure: Upper Endoscopic Ultrasound;  Surgeon: Jowell, Paul Simon, MD;  Location: [facility];  Service: Gastroenterology;  Laterality: N/A;  . ESOPHAGOGASTRODOUDENOSCOPY W/BIOPSY N/A 11/04/2017   Procedure: ESOPHAGOGASTRODUODENOSCOPY, FLEXIBLE, TRANSORAL; WITH BIOPSY,  SINGLE OR MULTIPLE;  Surgeon: Glena Deward Naegeli, MD;  Location: [facility];  Service: Gastroenterology;  Laterality: N/A;  . COLONOSCOPY    . LIVER SURGERY    . TUBAL LIGATION           MEDICATIONS:  Outpatient Encounter Medications as of 02/04/2024  Medication Sig Dispense Refill  . acetaminophen  (TYLENOL ) 650 MG ER tablet Take 1,300 mg by mouth every 8 (eight) hours as needed      . atorvastatin (LIPITOR) 20 MG tablet TAKE 1 TABLET BY MOUTH EVERY DAY 100 tablet 1  . blood glucose diagnostic (ACCU-CHEK GUIDE TEST STRIPS) test strip Check blood sugar once daily fasting. Dx: E11.9 100 strip 1  . blood glucose meter kit as directed (ACCU-CHEK DX E11.9) 1 each 0  . cholecalciferol (VITAMIN D3) 1000 unit capsule Take 2,000 Units by mouth once daily gummies    . dapagliflozin propanediol (FARXIGA) 5 mg tablet Take 1 tablet (5 mg total) by mouth once daily 100 tablet 1  . dextrose-fructose-sod citrate (NAUZENE) 968-175-230 mg Chew Take by mouth 2 (two) times daily as needed    . famotidine  (PEPCID ) 20 MG tablet Take 20 mg by mouth 2 (two) times daily    . fenofibrate 160 MG tablet Take 1 tablet (160 mg total) by mouth once daily 200 tablet 1  . glipiZIDE (GLUCOTROL) 10 MG tablet Take 1 tablet (10 mg total) by mouth 2 (two) times daily before meals 200 tablet 1  . lancets (ACCU-CHEK FASTCLIX LANCET DRUM) CHECK BLOOD SUGAR FASTING ONCE DAILY. ACCU-CHEK DX E11.9 102 each 1  . levothyroxine  (SYNTHROID ) 75 MCG tablet  Take 1 tablet (75 mcg total) by mouth once daily Take on an empty stomach with a glass of water at least 30-60 minutes before breakfast. 100 tablet 1  . lisinopriL (ZESTRIL) 10 MG tablet TAKE 1 TABLET BY MOUTH EVERY DAY 90 tablet 1  . loperamide (IMODIUM) 2 mg capsule Take 2 mg by mouth 4 (four) times daily as needed for Diarrhea    . metFORMIN (GLUCOPHAGE) 1000 MG tablet TAKE 1 TABLET BY MOUTH TWICE A DAY WITH MEALS 200 tablet 1  . pantoprazole  (PROTONIX ) 40 MG DR tablet TAKE 1  TABLET BY MOUTH 2 TIMES DAILY BEFORE MEALS. 180 tablet 0   No facility-administered encounter medications on file as of 02/04/2024.     ALLERGIES:   Patient has no known allergies.   SOCIAL HISTORY:  Social History   Socioeconomic History  . Marital status: Single  . Number of children: 5  Tobacco Use  . Smoking status: Never    Passive exposure: Never  . Smokeless tobacco: Never  Vaping Use  . Vaping status: Never Used  Substance and Sexual Activity  . Alcohol use: No    Alcohol/week: 0.0 standard drinks of alcohol  . Drug use: No  Social History Narrative   Marital Status- Separated   Lives with brother   Employment- Publishing copy   Exercise hx- None   Religious Affiliation-    Social Drivers of Health   Financial Resource Strain: Low Risk  (02/04/2024)   Overall Financial Resource Strain (CARDIA)   . Difficulty of Paying Living Expenses: Not hard at all  Food Insecurity: No Food Insecurity (02/04/2024)   Hunger Vital Sign   . Worried About Programme researcher, broadcasting/film/video in the Last Year: Never true   . Ran Out of Food in the Last Year: Never true  Transportation Needs: No Transportation Needs (02/04/2024)   PRAPARE - Transportation   . Lack of Transportation (Medical): No   . Lack of Transportation (Non-Medical): No    FAMILY HISTORY:  Family History  Problem Relation Name Age of Onset  . No Known Problems Mother    . No Known Problems Father    . Kidney disease Other    . No Known Problems Daughter    . No Known Problems Son    . No Known Problems Son    . No Known Problems Daughter    . No Known Problems Daughter       GENERAL REVIEW OF SYSTEMS:   General ROS: negative for - chills, fatigue, fever, weight gain or weight loss Allergy and Immunology ROS: negative for - hives  Hematological and Lymphatic ROS: negative for - bleeding problems or bruising, negative for palpable nodes Endocrine ROS: negative for - heat or cold intolerance, hair changes Respiratory  ROS: negative for - cough, shortness of breath or wheezing Cardiovascular ROS: no chest pain or palpitations GI ROS: negative for nausea, vomiting, positive for abdominal pain Musculoskeletal ROS: negative for - joint swelling or muscle pain Neurological ROS: negative for - confusion, syncope Dermatological ROS: negative for pruritus and rash  PHYSICAL EXAM:  Vitals:   02/04/24 1321  BP: 113/61  Pulse: 53  .  Ht:152.4 cm (5') Wt:64.9 kg (143 lb) ADJ:Anib surface area is 1.66 meters squared. Body mass index is 27.93 kg/m.SABRA   GENERAL: Alert, active, oriented x3  HEENT: Pupils equal reactive to light. Extraocular movements are intact. Sclera clear. Palpebral conjunctiva normal red color.Pharynx clear.  NECK: Supple with no palpable mass and no  adenopathy.  LUNGS: Sound clear with no rales rhonchi or wheezes.  HEART: Regular rhythm S1 and S2 without murmur.  ABDOMEN: Soft and depressible, nontender with no palpable mass, no hepatomegaly.   EXTREMITIES: Well-developed well-nourished symmetrical with no dependent edema.  NEUROLOGICAL: Awake alert oriented, facial expression symmetrical, moving all extremities.   Results RADIOLOGY Abdominal Ultrasound: Colitis without sign of cholecystitis (02/03/2024) Abdominal and Pelvic CT: No intra-abdominal pathology (02/02/2024) Abdominal MRI: Cholelithiasis without sign of cholecystitis (01/13/2024)    Assessment & Plan Cholelithiasis with biliary colic   Cholelithiasis was confirmed by an MR of the abdomen on January 13, 2024, and an ultrasound showing cholelithiasis without cholecystitis. A CT scan revealed no other intra-abdominal issues. She experiences intense right upper quadrant pain radiating to the back and shoulder, consistent with biliary colic. The pain is intermittent, lasting 4-5 minutes, and recurs every 8-10 minutes, unrelated to eating or activities. The pain is due to gallstones causing obstruction and pressure in the  gallbladder. Schedule a laparoscopic cholecystectomy at [facility]. Perform the surgery under general anesthesia with same-day discharge. Discuss surgical risks, including bleeding, infection, and injury to surrounding structures. Provide pre-operative instructions and process explanation by nursing staff.   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   Lucas Sjogren, MD  Electronically signed by Lucas Sjogren, MD

## 2024-02-05 ENCOUNTER — Ambulatory Visit: Payer: Self-pay | Admitting: General Surgery

## 2024-02-06 ENCOUNTER — Other Ambulatory Visit: Payer: Self-pay

## 2024-02-06 ENCOUNTER — Encounter
Admission: RE | Admit: 2024-02-06 | Discharge: 2024-02-06 | Disposition: A | Discharge status: Home / Self Care | Source: Ambulatory Visit | Attending: General Surgery | Admitting: General Surgery

## 2024-02-06 DIAGNOSIS — K8000 Calculus of gallbladder with acute cholecystitis without obstruction: Secondary | ICD-10-CM

## 2024-02-06 DIAGNOSIS — E119 Type 2 diabetes mellitus without complications: Secondary | ICD-10-CM

## 2024-02-06 HISTORY — DX: Calculus of gallbladder without cholecystitis without obstruction: K80.20

## 2024-02-06 NOTE — Pre-Procedure Instructions (Signed)
 PAT CALL COMPLETED WITH INTERPRETER ID # [id number], PATIENT REQUEST THAT ARRIVAL TIME CALL BE MADE TO HER DAUGHTER AT [phone].

## 2024-02-06 NOTE — Pre-Procedure Instructions (Signed)
 Call placed x2 with interpreter ID # [id number], Vm left on both numbers, will try again later today.

## 2024-02-06 NOTE — Patient Instructions (Addendum)
 Your procedure is scheduled on: 02/09/24 - Monday Report to the Registration Desk on the 1st floor of the [facility]. To find out your arrival time, please call [phone] between 1PM - 3PM on: 02/06/24 - Friday If your arrival time is 6:00 am, do not arrive before that time as the [facility] entrance doors do not open until 6:00 am.  REMEMBER: Instructions that are not followed completely may result in serious medical risk, up to and including death; or upon the discretion of your surgeon and anesthesiologist your surgery may need to be rescheduled.  Do not eat food after midnight the night before surgery.  No gum chewing or hard candies.  One week prior to surgery: Stop Anti-inflammatories (NSAIDS) such as Advil, Aleve, Ibuprofen, Motrin, Naproxen, Naprosyn and Aspirin based products such as Excedrin, Goody's Powder, BC Powder. You may continue to take Tylenol  if needed for pain up until the day of surgery.  Stop ANY OVER THE COUNTER supplements until after surgery : Vit D  metFORMIN (GLUCOPHAGE) hold beginning 02/07/24.  Doreen - hold beginning 02/06/24.  ON THE DAY OF SURGERY ONLY TAKE THESE MEDICATIONS WITH SIPS OF WATER:  levothyroxine  (SYNTHROID )  pantoprazole  (PROTONIX )    No Alcohol for 24 hours before or after surgery.  No Smoking including e-cigarettes for 24 hours before surgery.  No chewable tobacco products for at least 6 hours before surgery.  No nicotine patches on the day of surgery.  Do not use any recreational drugs for at least a week (preferably 2 weeks) before your surgery.  Please be advised that the combination of cocaine and anesthesia may have negative outcomes, up to and including death. If you test positive for cocaine, your surgery will be cancelled.  On the morning of surgery brush your teeth with toothpaste and water, you may rinse your mouth with mouthwash if you wish. Do not swallow any toothpaste or mouthwash.  Use CHG Soap or wipes  as directed on instruction sheet.  Do not wear jewelry, make-up, hairpins, clips or nail polish.  For welded (permanent) jewelry: bracelets, anklets, waist bands, etc.  Please have this removed prior to surgery.  If it is not removed, there is a chance that hospital personnel will need to cut it off on the day of surgery.  Do not wear lotions, powders, or perfumes.   Do not shave body hair from the neck down 48 hours before surgery.  Contact lenses, hearing aids and dentures may not be worn into surgery.  Do not bring valuables to the hospital. [facility] is not responsible for any missing/lost belongings or valuables.   Notify your doctor if there is any change in your medical condition (cold, fever, infection).  Wear comfortable clothing (specific to your surgery type) to the hospital.  After surgery, you can help prevent lung complications by doing breathing exercises.  Take deep breaths and cough every 1-2 hours. Your doctor may order a device called an Incentive Spirometer to help you take deep breaths.  When coughing or sneezing, hold a pillow firmly against your incision with both hands. This is called "splinting." Doing this helps protect your incision. It also decreases belly discomfort.  If you are being admitted to the hospital overnight, leave your suitcase in the car. After surgery it may be brought to your room.  In case of increased patient census, it may be necessary for you, the patient, to continue your postoperative care in the Same Day Surgery department.  If you are  being discharged the day of surgery, you will not be allowed to drive home. You will need a responsible individual to drive you home and stay with you for 24 hours after surgery.   If you are taking public transportation, you will need to have a responsible individual with you.  Please call the Pre-admissions Testing Dept. at [phone] if you have any questions about these  instructions.  Surgery Visitation Policy:  Patients having surgery or a procedure may have two visitors.  Children under the age of 33 must have an adult with them who is not the patient.  Inpatient Visitation:    Visiting hours are 7 a.m. to 8 p.m. Up to four visitors are allowed at one time in a patient room. The visitors may rotate out with other people during the day.  One visitor age 75 or older may stay with the patient overnight and must be in the room by 8 p.m.   Merchandiser, retail to address health-related social needs:  [email or web address]                                                                                                             Preparing for Surgery with CHLORHEXIDINE GLUCONATE (CHG) Soap  Chlorhexidine Gluconate (CHG) Soap  o An antiseptic cleaner that kills germs and bonds with the skin to continue killing germs even after washing  o Used for showering the night before surgery and morning of surgery  Before surgery, you can play an important role by reducing the number of germs on your skin.  CHG (Chlorhexidine gluconate) soap is an antiseptic cleanser which kills germs and bonds with the skin to continue killing germs even after washing.  Please do not use if you have an allergy to CHG or antibacterial soaps. If your skin becomes reddened/irritated stop using the CHG.  1. Shower the NIGHT BEFORE SURGERY with CHG soap.  2. If you choose to wash your hair, wash your hair first as usual with your normal shampoo.  3. After shampooing, rinse your hair and body thoroughly to remove the shampoo.  4. Use CHG as you would any other liquid soap. You can apply CHG directly to the skin and wash gently with a clean washcloth.  5. Apply the CHG soap to your body only from the neck down. Do not use on open wounds or open sores. Avoid contact with your eyes, ears, mouth, and genitals (private parts). Wash face and genitals (private parts)  with your normal soap.  6. Wash thoroughly, paying special attention to the area where your surgery will be performed.  7. Thoroughly rinse your body with warm water.  8. Do not shower/wash with your normal soap after using and rinsing off the CHG soap.  9. Do not use lotions, oils, etc., after showering with CHG.  10. Pat yourself dry with a clean towel.  11. Wear clean pajamas to bed the night before surgery.  12. Place clean sheets on your bed the night of your shower and do not sleep  with pets.  13. Do not apply any deodorants/lotions/powders.  14. Please wear clean clothes to the hospital.  15. Remember to brush your teeth with your regular toothpaste.

## 2024-02-09 ENCOUNTER — Other Ambulatory Visit: Payer: Self-pay

## 2024-02-09 ENCOUNTER — Encounter: Payer: Self-pay | Admitting: General Surgery

## 2024-02-09 ENCOUNTER — Ambulatory Visit

## 2024-02-09 ENCOUNTER — Encounter
Admission: RE | Disposition: A | Discharge status: Home / Self Care | Payer: Self-pay | Source: Home / Self Care | Attending: General Surgery

## 2024-02-09 ENCOUNTER — Ambulatory Visit
Admission: RE | Admit: 2024-02-09 | Discharge: 2024-02-09 | Disposition: A | Discharge status: Home / Self Care | Attending: General Surgery | Admitting: General Surgery

## 2024-02-09 DIAGNOSIS — I10 Essential (primary) hypertension: Secondary | ICD-10-CM | POA: Insufficient documentation

## 2024-02-09 DIAGNOSIS — E119 Type 2 diabetes mellitus without complications: Secondary | ICD-10-CM | POA: Diagnosis not present

## 2024-02-09 DIAGNOSIS — Z79890 Hormone replacement therapy (postmenopausal): Secondary | ICD-10-CM | POA: Diagnosis not present

## 2024-02-09 DIAGNOSIS — K8010 Calculus of gallbladder with chronic cholecystitis without obstruction: Secondary | ICD-10-CM | POA: Diagnosis present

## 2024-02-09 DIAGNOSIS — E039 Hypothyroidism, unspecified: Secondary | ICD-10-CM | POA: Insufficient documentation

## 2024-02-09 DIAGNOSIS — B181 Chronic viral hepatitis B without delta-agent: Secondary | ICD-10-CM | POA: Diagnosis not present

## 2024-02-09 DIAGNOSIS — Z7984 Long term (current) use of oral hypoglycemic drugs: Secondary | ICD-10-CM | POA: Insufficient documentation

## 2024-02-09 LAB — GLUCOSE, CAPILLARY
Glucose-Capillary: 134 mg/dL — ABNORMAL HIGH (ref 70–99)
Glucose-Capillary: 202 mg/dL — ABNORMAL HIGH (ref 70–99)

## 2024-02-09 SURGERY — CHOLECYSTECTOMY, ROBOT-ASSISTED, LAPAROSCOPIC
Anesthesia: General | Site: Abdomen

## 2024-02-09 MED ORDER — 0.9 % SODIUM CHLORIDE (POUR BTL) OPTIME
TOPICAL | Status: DC | PRN
  Administered 2024-02-09: 500 mL

## 2024-02-09 MED ORDER — PROPOFOL 10 MG/ML IV BOLUS
INTRAVENOUS | Status: DC | PRN
  Administered 2024-02-09: 130 mg via INTRAVENOUS

## 2024-02-09 MED ORDER — DEXAMETHASONE SOD PHOSPHATE PF 10 MG/ML IJ SOLN
INTRAMUSCULAR | Status: DC | PRN
  Administered 2024-02-09: 5 mg via INTRAVENOUS

## 2024-02-09 MED ORDER — SODIUM CHLORIDE 0.9 % IV SOLN: INTRAVENOUS | Status: DC

## 2024-02-09 MED ORDER — CEFAZOLIN SODIUM-DEXTROSE 2-4 GM/100ML-% IV SOLN
INTRAVENOUS | Status: AC
  Filled 2024-02-09: qty 100

## 2024-02-09 MED ORDER — GLYCOPYRROLATE 0.2 MG/ML IJ SOLN
INTRAMUSCULAR | Status: DC | PRN
Start: 2024-02-09 — End: 2024-02-09
  Administered 2024-02-09: .2 mg via INTRAVENOUS

## 2024-02-09 MED ORDER — FENTANYL CITRATE (PF) 100 MCG/2ML IJ SOLN
INTRAMUSCULAR | Status: DC | PRN
  Administered 2024-02-09 (×2): 50 ug via INTRAVENOUS

## 2024-02-09 MED ORDER — CHLORHEXIDINE GLUCONATE 0.12 % MT SOLN
15.0000 mL | Freq: Once | OROMUCOSAL | Status: AC
  Administered 2024-02-09: 15 mL via OROMUCOSAL

## 2024-02-09 MED ORDER — INDOCYANINE GREEN 25 MG IV SOLR
1.2500 mg | Freq: Once | INTRAVENOUS | Status: AC
  Administered 2024-02-09: 1.25 mg via INTRAVENOUS

## 2024-02-09 MED ORDER — FENTANYL CITRATE (PF) 100 MCG/2ML IJ SOLN
INTRAMUSCULAR | Status: AC
  Filled 2024-02-09: qty 2

## 2024-02-09 MED ORDER — HYDROCODONE-ACETAMINOPHEN 5-325 MG PO TABS
1.0000 | ORAL_TABLET | Freq: Once | ORAL | Status: AC
  Administered 2024-02-09: 1 via ORAL

## 2024-02-09 MED ORDER — ROCURONIUM BROMIDE 10 MG/ML (PF) SYRINGE
PREFILLED_SYRINGE | INTRAVENOUS | Status: AC
  Filled 2024-02-09: qty 10

## 2024-02-09 MED ORDER — CEFAZOLIN SODIUM-DEXTROSE 2-4 GM/100ML-% IV SOLN
2.0000 g | INTRAVENOUS | Status: AC
  Administered 2024-02-09: 2 g via INTRAVENOUS

## 2024-02-09 MED ORDER — LIDOCAINE HCL (PF) 2 % IJ SOLN
INTRAMUSCULAR | Status: DC | PRN
Start: 2024-02-09 — End: 2024-02-09
  Administered 2024-02-09: 60 mg via INTRADERMAL

## 2024-02-09 MED ORDER — ACETAMINOPHEN 10 MG/ML IV SOLN
INTRAVENOUS | Status: AC
  Filled 2024-02-09: qty 100

## 2024-02-09 MED ORDER — ONDANSETRON HCL 4 MG/2ML IJ SOLN
INTRAMUSCULAR | Status: DC | PRN
  Administered 2024-02-09: 4 mg via INTRAVENOUS

## 2024-02-09 MED ORDER — ONDANSETRON HCL 4 MG/2ML IJ SOLN
INTRAMUSCULAR | Status: AC
  Filled 2024-02-09: qty 2

## 2024-02-09 MED ORDER — INDOCYANINE GREEN 25 MG IV SOLR
INTRAVENOUS | Status: AC
  Filled 2024-02-09: qty 10

## 2024-02-09 MED ORDER — ORAL CARE MOUTH RINSE: 15.0000 mL | Freq: Once | OROMUCOSAL | Status: AC

## 2024-02-09 MED ORDER — HYDROCODONE-ACETAMINOPHEN 5-325 MG PO TABS
1.0000 | ORAL_TABLET | Freq: Four times a day (QID) | ORAL | 0 refills | Status: AC | PRN
  Filled 2024-02-09: qty 12, 3d supply, fill #0

## 2024-02-09 MED ORDER — PROPOFOL 10 MG/ML IV BOLUS
INTRAVENOUS | Status: AC
  Filled 2024-02-09: qty 40

## 2024-02-09 MED ORDER — BUPIVACAINE-EPINEPHRINE 0.25% -1:200000 IJ SOLN
INTRAMUSCULAR | Status: DC | PRN
  Administered 2024-02-09: 30 mL

## 2024-02-09 MED ORDER — SUGAMMADEX SODIUM 200 MG/2ML IV SOLN
INTRAVENOUS | Status: DC | PRN
  Administered 2024-02-09: 263.2 mg via INTRAVENOUS

## 2024-02-09 MED ORDER — EPHEDRINE SULFATE-NACL 50-0.9 MG/10ML-% IV SOSY
PREFILLED_SYRINGE | INTRAVENOUS | Status: DC | PRN
Start: 2024-02-09 — End: 2024-02-09
  Administered 2024-02-09 (×2): 5 mg via INTRAVENOUS

## 2024-02-09 MED ORDER — DROPERIDOL 2.5 MG/ML IJ SOLN: 0.6250 mg | Freq: Once | INTRAMUSCULAR | Status: DC | PRN

## 2024-02-09 MED ORDER — PHENYLEPHRINE 80 MCG/ML (10ML) SYRINGE FOR IV PUSH (FOR BLOOD PRESSURE SUPPORT)
PREFILLED_SYRINGE | INTRAVENOUS | Status: DC | PRN
  Administered 2024-02-09: 80 ug via INTRAVENOUS

## 2024-02-09 MED ORDER — ROCURONIUM BROMIDE 10 MG/ML (PF) SYRINGE
PREFILLED_SYRINGE | INTRAVENOUS | Status: DC | PRN
  Administered 2024-02-09: 50 mg via INTRAVENOUS
  Administered 2024-02-09: 10 mg via INTRAVENOUS

## 2024-02-09 MED ORDER — BUPIVACAINE-EPINEPHRINE (PF) 0.25% -1:200000 IJ SOLN
INTRAMUSCULAR | Status: AC
  Filled 2024-02-09: qty 30

## 2024-02-09 MED ORDER — HYDROCODONE-ACETAMINOPHEN 5-325 MG PO TABS
ORAL_TABLET | ORAL | Status: AC
  Filled 2024-02-09: qty 1

## 2024-02-09 MED ORDER — FENTANYL CITRATE (PF) 100 MCG/2ML IJ SOLN: 25.0000 ug | INTRAMUSCULAR | Status: DC | PRN

## 2024-02-09 MED ORDER — LIDOCAINE HCL (PF) 2 % IJ SOLN
INTRAMUSCULAR | Status: AC
  Filled 2024-02-09: qty 5

## 2024-02-09 MED ORDER — CHLORHEXIDINE GLUCONATE 0.12 % MT SOLN
OROMUCOSAL | Status: AC
Start: 2024-02-09 — End: 2024-02-09
  Filled 2024-02-09: qty 15

## 2024-02-09 MED ORDER — PHENYLEPHRINE HCL-NACL 20-0.9 MG/250ML-% IV SOLN
INTRAVENOUS | Status: AC
  Filled 2024-02-09: qty 250

## 2024-02-09 SURGICAL SUPPLY — 42 items
BAG PRESSURE INF REUSE 1000 (BAG) IMPLANT
CANNULA REDUCER 12-8 DVNC XI (CANNULA) ×1 IMPLANT
CAUTERY HOOK MNPLR 1.6 DVNC XI (INSTRUMENTS) ×1 IMPLANT
CLIP LIGATING HEM O LOK PURPLE (MISCELLANEOUS) IMPLANT
CLIP LIGATING HEMO O LOK GREEN (MISCELLANEOUS) ×1 IMPLANT
DEFOGGER SCOPE WARM SEASHARP (MISCELLANEOUS) ×1 IMPLANT
DERMABOND ADVANCED .7 DNX12 (GAUZE/BANDAGES/DRESSINGS) ×1 IMPLANT
DRAPE ARM DVNC X/XI (DISPOSABLE) ×4 IMPLANT
DRAPE C-ARM XRAY 36X54 (DRAPES) IMPLANT
DRAPE COLUMN DVNC XI (DISPOSABLE) ×1 IMPLANT
ELECTRODE REM PT RTRN 9FT ADLT (ELECTROSURGICAL) ×1 IMPLANT
FORCEPS BPLR 8 MD DVNC XI (FORCEP) IMPLANT
FORCEPS BPLR FENES DVNC XI (FORCEP) ×1 IMPLANT
FORCEPS PROGRASP DVNC XI (FORCEP) ×1 IMPLANT
GLOVE BIO SURGEON STRL SZ 6.5 (GLOVE) ×2 IMPLANT
GLOVE BIOGEL PI IND STRL 6.5 (GLOVE) ×2 IMPLANT
GLOVE SURG SYN 6.5 PF PI (GLOVE) ×2 IMPLANT
GOWN STRL REUS W/ TWL LRG LVL3 (GOWN DISPOSABLE) ×4 IMPLANT
GRASPER SUT TROCAR 14GX15 (MISCELLANEOUS) ×1 IMPLANT
IRRIGATOR SUCT 8 DISP DVNC XI (IRRIGATION / IRRIGATOR) IMPLANT
IV 0.9% NACL 1000 ML (IV SOLUTION) IMPLANT
IV CATH ANGIO 12GX3 LT BLUE (NEEDLE) IMPLANT
KIT PINK PAD W/HEAD ARM REST (MISCELLANEOUS) ×1 IMPLANT
LABEL OR SOLS (LABEL) ×1 IMPLANT
MANIFOLD NEPTUNE II (INSTRUMENTS) IMPLANT
NDL HYPO 22X1.5 SAFETY MO (MISCELLANEOUS) ×1 IMPLANT
NDL INSUFFLATION 14GA 120MM (NEEDLE) ×1 IMPLANT
NEEDLE HYPO 22X1.5 SAFETY MO (MISCELLANEOUS) ×1 IMPLANT
NEEDLE INSUFFLATION 14GA 120MM (NEEDLE) ×1 IMPLANT
NS IRRIG 500ML POUR BTL (IV SOLUTION) ×1 IMPLANT
OBTURATOR OPTICALSTD 8 DVNC (TROCAR) ×1 IMPLANT
PACK LAP CHOLECYSTECTOMY (MISCELLANEOUS) ×1 IMPLANT
SEAL UNIV 5-12 XI (MISCELLANEOUS) ×4 IMPLANT
SET TUBE SMOKE EVAC HIGH FLOW (TUBING) ×1 IMPLANT
SOLUTION ELECTROSURG ANTI STCK (MISCELLANEOUS) ×1 IMPLANT
SPIKE FLUID TRANSFER (MISCELLANEOUS) ×2 IMPLANT
SPONGE T-LAP 4X18 ~~LOC~~+RFID (SPONGE) IMPLANT
SUT VICRYL 0 UR6 27IN ABS (SUTURE) ×1 IMPLANT
SUTURE MNCRL 4-0 27XMF (SUTURE) ×1 IMPLANT
SYSTEM BAG RETRIEVAL 10MM (BASKET) ×1 IMPLANT
TRAP FLUID SMOKE EVACUATOR (MISCELLANEOUS) IMPLANT
WATER STERILE IRR 500ML POUR (IV SOLUTION) ×1 IMPLANT

## 2024-02-09 NOTE — Interval H&P Note (Signed)
 History and Physical Interval Note:  02/09/2024 7:23 AM  Sydney Howe  has presented today for surgery, with the diagnosis of K80.10 CCC.  The various methods of treatment have been discussed with the patient and family. After consideration of risks, benefits and other options for treatment, the patient has consented to  Procedure(s): CHOLECYSTECTOMY, ROBOT-ASSISTED, LAPAROSCOPIC (N/A) as a surgical intervention.  The patient's history has been reviewed, patient examined, no change in status, stable for surgery.  I have reviewed the patient's chart and labs.  Questions were answered to the patient's satisfaction.     Lucas Sjogren

## 2024-02-09 NOTE — Anesthesia Procedure Notes (Signed)
 Procedure Name: Intubation Date/Time: 02/09/2024 7:46 AM  Performed by: Seynabou Fults, CRNAPre-anesthesia Checklist: Patient identified, Emergency Drugs available, Suction available and Patient being monitored Patient Re-evaluated:Patient Re-evaluated prior to induction Oxygen Delivery Method: Circle System Utilized Preoxygenation: Pre-oxygenation with 100% oxygen Induction Type: IV induction Ventilation: Mask ventilation without difficulty and Oral airway inserted - appropriate to patient size Laryngoscope Size: Mac and 3 Grade View: Grade I Tube type: Oral Tube size: 6.5 mm Number of attempts: 1 Airway Equipment and Method: Stylet and Oral airway Placement Confirmation: ETT inserted through vocal cords under direct vision, positive ETCO2 and breath sounds checked- equal and bilateral Secured at: 20 cm Tube secured with: Tape Dental Injury: Teeth and Oropharynx as per pre-operative assessment  Comments: Easy atraumatic intubation, teeth, tongue and lips unchanged. Head and neck midline

## 2024-02-09 NOTE — Discharge Instructions (Signed)

## 2024-02-09 NOTE — Transfer of Care (Signed)
 Immediate Anesthesia Transfer of Care Note  Patient: Braeden Stankowski  Procedure(s) Performed: CHOLECYSTECTOMY, ROBOT-ASSISTED, LAPAROSCOPIC (Abdomen)  Patient Location: PACU  Anesthesia Type:General  Level of Consciousness: drowsy  Airway & Oxygen Therapy: Patient Spontanous Breathing  Post-op Assessment: Report given to RN and Post -op Vital signs reviewed and stable  Post vital signs: Reviewed and stable  Last Vitals:  Vitals Value Taken Time  BP 120/57 08:54  Temp 98.3 08:54  Pulse 69 02/09/24 08:54  Resp 15 02/09/24 08:54  SpO2 99 % 02/09/24 08:54  Vitals shown include unfiled device data.  Last Pain:  Vitals:   02/09/24 0639  PainSc: 0-No pain         Complications: No notable events documented.

## 2024-02-09 NOTE — Anesthesia Preprocedure Evaluation (Signed)
 Anesthesia Evaluation  Patient identified by MRN, date of birth, ID band Patient awake    Reviewed: Allergy & Precautions, H&P , NPO status , Patient's Chart, lab work & pertinent test results  History of Anesthesia Complications Negative for: history of anesthetic complications  Airway Mallampati: II  TM Distance: >3 FB Neck ROM: full    Dental  (+) Poor Dentition, Chipped, Missing, Dental Advidsory Given   Pulmonary neg pulmonary ROS   Pulmonary exam normal breath sounds clear to auscultation       Cardiovascular Exercise Tolerance: Good hypertension, (-) angina (-) Past MI Normal cardiovascular exam(-) dysrhythmias (-) Valvular Problems/Murmurs Rhythm:regular Rate:Normal     Neuro/Psych negative neurological ROS  negative psych ROS   GI/Hepatic ,GERD  Controlled,,(+) Hepatitis -, B  Endo/Other  diabetes, Type 2Hypothyroidism    Renal/GU negative Renal ROS  negative genitourinary   Musculoskeletal   Abdominal   Peds  Hematology negative hematology ROS (+)   Anesthesia Other Findings Past Medical History:   Hypertension                                                 Graves disease                                               Hyperuricemia                                                Rheumatoid factor positive                                   Chronic hepatitis B                                          Acquired hypothyroidism                                      Diabetes mellitus without complication                       GERD (gastroesophageal reflux disease)                       Reproductive/Obstetrics negative OB ROS                              Anesthesia Physical Anesthesia Plan  ASA: III  Anesthesia Plan: General   Post-op Pain Management:    Induction: Intravenous  PONV Risk Score and Plan: 3 and Ondansetron , Dexamethasone and Treatment may vary due to age or  medical condition  Airway Management Planned: Oral ETT  Additional Equipment:   Intra-op Plan:   Post-operative Plan: Extubation in OR  Informed Consent: I have reviewed the patients History and Physical, chart, labs and discussed  the procedure including the risks, benefits and alternatives for the proposed anesthesia with the patient or authorized representative who has indicated his/her understanding and acceptance.     Dental Advisory Given  Plan Discussed with: Anesthesiologist, CRNA and Surgeon  Anesthesia Plan Comments:          Anesthesia Quick Evaluation

## 2024-02-09 NOTE — Op Note (Addendum)
 Preoperative diagnosis: Chronic cholecystitis  Postoperative diagnosis: Same  Procedure: Robotic Assisted Laparoscopic Cholecystectomy.   Anesthesia: GETA   Surgeon: Dr. Cesar Coe  Wound Classification: Clean Contaminated  Indications: Patient is a 73 y.o. female developed right upper quadrant pain and on workup was found to have gallstones. Robotic Assisted Laparoscopic cholecystectomy was elected.  Findings:  Critical view of safety achieved Cystic duct and artery identified, ligated and divided Adequate hemostasis  Description of procedure: The patient was placed on the operating table in the supine position. General anesthesia was induced. A time-out was completed verifying correct patient, procedure, site, positioning, and implant(s) and/or special equipment prior to beginning this procedure. An orogastric tube was placed. The abdomen was prepped and draped in the usual sterile fashion.  An incision was made in a natural skin line below the umbilicus.  The fascia was elevated and the Veress needle inserted. Proper position was confirmed by aspiration and saline meniscus test.  The abdomen was insufflated with carbon dioxide to a pressure of 15 mmHg. The patient tolerated insufflation well. A 8-mm trocar was then inserted in optiview fashion.  The laparoscope was inserted and the abdomen inspected. No injuries from initial trocar placement were noted. Additional trocars were then inserted in the following locations: an 8-mm trocar in the left lateral abdomen, and another two 8-mm trocars to the right side of the abdomen 5 cm appart. The umbilical trocar was changed to a 12 mm trocar all under direct visualization. The abdomen was inspected and no abnormalities were found. The table was placed in the reverse Trendelenburg position with the right side up. The robotic arms were docked and target anatomy identified. Instrument inserted under direct visualization.  Filmy adhesions between  the gallbladder and omentum, duodenum and transverse colon were lysed with electrocautery. The dome of the gallbladder was grasped with a prograsp and retracted over the dome of the liver. The infundibulum was also grasped with an atraumatic grasper and retracted toward the right lower quadrant. This maneuver exposed Calot's triangle. The peritoneum overlying the gallbladder infundibulum was then incised and the cystic duct and cystic artery identified and circumferentially dissected. Critical view of safety reviewed before ligating any structure. Firefly images taken to visualize biliary ducts. The cystic duct and cystic artery were then doubly clipped and divided close to the gallbladder.  The gallbladder was then dissected from its peritoneal attachments by electrocautery. Hemostasis was checked and the gallbladder was removed using an endoscopic retrieval bag. The gallbladder was passed off the table as a specimen. There was no evidence of bleeding from the gallbladder fossa or cystic artery or leakage of the bile from the cystic duct stump. Secondary trocars were removed under direct vision. No bleeding was noted. The robotic arms were undoked. The scope was withdrawn and the umbilical trocar removed. The abdomen was allowed to collapse. The fascia of the 12mm trocar sites was closed with figure-of-eight 0 vicryl sutures. The skin was closed with subcuticular sutures of 4-0 monocryl and topical skin adhesive. The orogastric tube was removed.  The patient tolerated the procedure well and was taken to the postanesthesia care unit in stable condition.   Specimen: Gallbladder  Complications: None  EBL: 5 mL

## 2024-02-10 LAB — SURGICAL PATHOLOGY

## 2024-02-14 NOTE — Anesthesia Postprocedure Evaluation (Signed)
 Anesthesia Post Note  Patient: Sydney Howe  Procedure(s) Performed: CHOLECYSTECTOMY, ROBOT-ASSISTED, LAPAROSCOPIC (Abdomen)  Patient location during evaluation: PACU Anesthesia Type: General Level of consciousness: awake and alert Pain management: pain level controlled Vital Signs Assessment: post-procedure vital signs reviewed and stable Respiratory status: spontaneous breathing, nonlabored ventilation, respiratory function stable and patient connected to nasal cannula oxygen Cardiovascular status: blood pressure returned to baseline and stable Postop Assessment: no apparent nausea or vomiting Anesthetic complications: no   No notable events documented.   Last Vitals:  Vitals:   02/09/24 0945 02/09/24 0959  BP: (!) 113/56 130/67  Pulse: (!) 54 (!) 55  Resp: 14 16  Temp: 36.7 C (!) 36.2 C  SpO2: 97% 100%    Last Pain:  Vitals:   02/09/24 0959  TempSrc: Temporal  PainSc: 0-No pain                 Prentice Murphy

## 2024-02-17 ENCOUNTER — Other Ambulatory Visit: Payer: Self-pay

## 2024-02-23 ENCOUNTER — Other Ambulatory Visit

## 2024-03-08 ENCOUNTER — Other Ambulatory Visit: Payer: Self-pay | Admitting: Family Medicine

## 2024-03-08 DIAGNOSIS — Z1231 Encounter for screening mammogram for malignant neoplasm of breast: Secondary | ICD-10-CM

## 2024-04-13 ENCOUNTER — Ambulatory Visit
Admission: RE | Admit: 2024-04-13 | Discharge: 2024-04-13 | Disposition: A | Source: Ambulatory Visit | Attending: Family Medicine | Admitting: Family Medicine

## 2024-04-13 DIAGNOSIS — Z1231 Encounter for screening mammogram for malignant neoplasm of breast: Secondary | ICD-10-CM | POA: Diagnosis present
